# Patient Record
Sex: Female | Born: 1950 | Race: White | Hispanic: No | Marital: Married | Smoking: Current every day smoker
Health system: Southern US, Community
[De-identification: ages and names within clinical notes are randomized; demographics above are authoritative.]

## PROBLEM LIST (undated history)

## (undated) DIAGNOSIS — Z86718 Personal history of other venous thrombosis and embolism: Secondary | ICD-10-CM

## (undated) DIAGNOSIS — E039 Hypothyroidism, unspecified: Secondary | ICD-10-CM

## (undated) DIAGNOSIS — K219 Gastro-esophageal reflux disease without esophagitis: Secondary | ICD-10-CM

## (undated) DIAGNOSIS — I10 Essential (primary) hypertension: Secondary | ICD-10-CM

## (undated) DIAGNOSIS — M1990 Unspecified osteoarthritis, unspecified site: Secondary | ICD-10-CM

## (undated) DIAGNOSIS — I739 Peripheral vascular disease, unspecified: Secondary | ICD-10-CM

## (undated) DIAGNOSIS — E119 Type 2 diabetes mellitus without complications: Secondary | ICD-10-CM

## (undated) HISTORY — PX: OTHER SURGICAL HISTORY: SHX169

## (undated) HISTORY — PX: ABDOMINAL HYSTERECTOMY: SHX81

## (undated) HISTORY — PX: BACK SURGERY: SHX140

## (undated) HISTORY — PX: VARICOSE VEIN SURGERY: SHX832

---

## 2006-03-01 ENCOUNTER — Inpatient Hospital Stay (HOSPITAL_COMMUNITY): Admission: RE | Admit: 2006-03-01 | Discharge: 2006-03-04 | Payer: Self-pay | Admitting: Neurological Surgery

## 2006-03-28 ENCOUNTER — Encounter: Admission: RE | Admit: 2006-03-28 | Discharge: 2006-03-28 | Payer: Self-pay | Admitting: Neurological Surgery

## 2006-06-06 ENCOUNTER — Encounter: Admission: RE | Admit: 2006-06-06 | Discharge: 2006-06-06 | Payer: Self-pay | Admitting: Neurological Surgery

## 2006-09-04 ENCOUNTER — Encounter: Admission: RE | Admit: 2006-09-04 | Discharge: 2006-09-04 | Payer: Self-pay | Admitting: Neurological Surgery

## 2006-12-04 ENCOUNTER — Encounter: Admission: RE | Admit: 2006-12-04 | Discharge: 2006-12-04 | Payer: Self-pay | Admitting: Neurological Surgery

## 2007-03-05 ENCOUNTER — Encounter: Admission: RE | Admit: 2007-03-05 | Discharge: 2007-03-05 | Payer: Self-pay | Admitting: Neurological Surgery

## 2010-10-01 NOTE — Discharge Summary (Signed)
[name], Holder               ACCOUNT NO.:  [id number]   MEDICAL RECORD NO.:  [id number]          PATIENT TYPE:  INP   LOCATION:  3003                         FACILITY:  [facility]   PHYSICIAN:  Tia Alert, MD     DATE OF BIRTH:  1951/05/12   DATE OF ADMISSION:  03/01/2006  DATE OF DISCHARGE:  03/04/2006                                 DISCHARGE SUMMARY   ADMITTING DIAGNOSES:  1. 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  2. Back and leg pain.   PROCEDURE:  Transforaminal lumbar interbody fusion L4-5.   HISTORY OF PRESENT ILLNESS:  [name] is a very pleasant 60 year old  female who presented with severe back pain and leg pain, and was found to  have severe degenerative disk disease at L4-5 with collapse of the disk  space with a disk herniation.  She had severe back and left leg pain.  She  had failed medical management.  I recommended a transforaminal lumbar  interbody fusion at L4-5 with non-segmental instrumentation.  She understood  the risks, benefits and suspected outcome, and wished to proceed.   HOSPITAL COURSE:  The patient was admitted on March 01, 2006, and taken to  the operating room where she underwent a TLIF at L4-5.  The patient  tolerated the procedure well, and then was taken to the recovery room and  then to the neurosurgical floor in stable condition.  For details of the  operative procedure, please see the dictated operative note.  The patient's  hospital course was routine, there were no complications.  The first night  she spent at bedrest with a Foley catheter in place, and a Dilaudid low-dose  PCA for pain control.  The next day she was allowed out of bed.  She was  able to get to the bathroom without difficulty.  Hemovac drain remained in  place until postoperative day 2.  She continued on a PCA until postoperative  day 2.  On postoperative day 2 her Hemovac was removed, and she was taken  off of her PCA.  Her pain was  well-controlled on oral pain medication.  She  was getting to the point she was walking in the hallways without difficulty.  She was afebrile with stable vital signs was tolerating a regular diet.  She  was discharged home in stable condition on November 02, 2005.   DISCHARGE MEDICATIONS:  1. Percocet 5/325 one to two p.o. q.6 h p.r.n. pain #90 pills and no      refills.  2. Flexeril 10 mg p.o. t.i.d. #60 pills and two refills.   Her return appointment is in 2 weeks.   FINAL DIAGNOSIS:  Transforaminal lumbar interbody fusion L4-5.      Tia Alert, MD  Electronically Signed     DSJ/MEDQ  D:  03/04/2006  T:  03/05/2006  Job:  [id number]

## 2010-10-01 NOTE — Op Note (Signed)
NAMEDARIEN, MIGNOGNA               ACCOUNT NO.:  [id number]   MEDICAL RECORD NO.:  [id number]          PATIENT TYPE:  INP   LOCATION:  2899                         FACILITY:  [facility]   PHYSICIAN:  Tia Alert, MD     DATE OF BIRTH:  [birth year]   DATE OF PROCEDURE:  03/01/2006  DATE OF DISCHARGE:                                 OPERATIVE REPORT   PREOPERATIVE DIAGNOSIS:  Severe degenerative disease, L4-5 with disk  herniation and foraminal stenosis with back and left leg pain.   POSTOPERATIVE DIAGNOSIS:  Severe degenerative disease, L4-5 with disk  herniation and foraminal stenosis with back and left leg pain.   PROCEDURE:  1. Decompressive lumbar hemilaminectomy, hemifacetectomy and foraminotomy,      L4-5 on the left for central canal L4 and L5 nerve root decompression      requiring more work than is typically required for simple      transforaminal lumbar interbody fusion procedure.  2. Transforaminal lumbar interbody fusion, L4-5 on the left utilizing a 8      x 22 mm PEEK interbody cage packed with local autograft and the BMP      soaked sponge.  Posterior lateral arthrodesis, L4-5 on the right      utilizing BMP soaked sponges and local autograft.  3. Nonsegmental fixation, L4-5 utilizing the legacy pedicle screw and rod      fixation on the left with a Spire interspinous plate.   SURGEON:  Dr. Marikay Alar.   ASSISTANT:  Dr. Donalee Citrin.   ANESTHESIA:  General tracheal.   COMPLICATIONS:  None apparent.   INDICATIONS FOR PROCEDURE:  Ms. Galli is 60 year old female who presented  with back and left leg pain.  She had tried medical management for some time  without significant relief.  She had MRI which showed severe degenerative  disease with scoliosis at L4-5, concave to the left with severe foraminal  stenosis at L4-5 on the left side with a foraminal disk herniation.  I  recommended a transforaminal lumbar interbody fusion from the left side to  decompress the canal  and opened up the foramen followed by instrumented  fusion utilizing pedicle screws and a Spire plate.  The patient understood  the risks, benefits, expected outcome and wished to proceed.   DESCRIPTION OF PROCEDURE:  The patient was taken to operating room after  induction of adequate generalized endotracheal anesthesia, she was rolled  into prone position on chest rolls and all pressure points padded on her  lumbar region, with DuraPrep and draped in the usual sterile fashion.  7 mL  local anesthesia was injected and a dorsal midline incision was made and  carried down to the lumbosacral fascia.  The fascia was opened bilaterally  and taken down in subperiosteal fashion to expose L4-5 interspace.  Intraoperative fluoroscopy confirmed my level and then I took the dissection  out over the facettes at L4-5 on the left side to expose the L4-L5  transverse processes.  I then used the Kerrison punch to perform a  hemilaminectomy, hemifacetectomy and foraminotomy at L4-5 on the left  side.  The underlying yellow ligament was removed to expose underlying dura and L5  nerve root.  I dissected along the medial pedicle wall to the superior part  of the pedicle and carried the dissection out into the foramen.  I also  identified the L4 nerve root followed out into the foramen decompressed it.  When I was done, a complete facetectomy had been completed, the foramen was  wide open and L4-L5 nerve roots were well decompressed.  She had a  significant spondylitic ridge over the disk space which was removed with  osteophyte remover.  The disk space was severely collapsed.  We were able to  get into this with a chisel and opened the disk space.  We then sequentially  distracted the disk space from the left side up to 8 mm checking this under  fluoroscopy.  We then prepared the disk space utilizing combination of  scrapers and curettes to perform as thorough a diskectomy as we could from a  TLIF approach.   We tried to remove disk from the midline and from the  opposite side with pituitary rongeurs and with these curettes.  Once our  diskectomy was complete and we had our end plates prepared.  We packed the  midline and with BMP soaked sponge and local autograft and then used a 8 x  22 mm PEEK interbody cage and tapped this into position from a TLIF approach  at L4-5 on the left side.  We then checked this under fluoroscopy. It was  very difficult to get this to cross the midline because of the collapsed  disk space and the tightness of this collapse.  We then turned our attention  to the nonsegmental fixation.  We localized our pedicle screw entry points  on the left side utilizing surface landmarks and lateral fluoroscopy.  We  probed each pedicle with a pedicle probe, tapped each pedicle with a 5.5  tap, palpated each pedicle with a ball probe to assure no break in the  cortex and placed 6.5 x 40 mm pedicle screws into the pedicles of L4 and L5  on the left side.  We then turned our attention to the [facility] plating.  We  used a right angle dissector to create a hole in the interspinous ligament  between L4 and L5 and then brought the post of the Spire clamp through this,  used clamps to close the spire plate on the spinous process and then used a  locking nut to tighten this into place.  With lateral x-ray and it seemed  like he was quite low so we removed the locking nut, removed the Spire plate  and inspected where we had this and ended up we were in the correct place  with our clamp across the L4-L5 spinous processes.  Therefore we tightened  it once again and locked it and into position.  We then irrigated with  saline solution containing bacitracin, dried all bleeding points, bipolar  cautery, decorticated the lamina of L4-L5 on the patient's right side and  placed a mixture of BMP soaked sponges and local autograft out over this for interlaminar fusion on the patient's right side.  We  then placed a medium  Hemovac drain through a separate stab incision and closed the fascia with 0  Vicryl, closed subcutaneous and subcuticular tissue with 2-0 and 3-0 Vicryl  and closed the skin with Benzoin and Steri-Strips.  The drapes removed.  Sterile dressing was applied.  The patient  awakened from general anesthesia  and transported to recovery room in stable condition.  At the end of the  procedure all sponge, needle and instrument counts were correct.      Tia Alert, MD  Electronically Signed     DSJ/MEDQ  D:  03/01/2006  T:  03/02/2006  Job:  [id number]

## 2011-11-14 ENCOUNTER — Other Ambulatory Visit: Payer: Self-pay | Admitting: Neurological Surgery

## 2011-11-14 DIAGNOSIS — M549 Dorsalgia, unspecified: Secondary | ICD-10-CM

## 2011-11-15 ENCOUNTER — Ambulatory Visit
Admission: RE | Admit: 2011-11-15 | Discharge: 2011-11-15 | Disposition: A | Discharge status: Home / Self Care | Payer: 59 | Source: Ambulatory Visit | Attending: Neurological Surgery | Admitting: Neurological Surgery

## 2011-11-15 VITALS — BP 123/67 | HR 60

## 2011-11-15 DIAGNOSIS — M549 Dorsalgia, unspecified: Secondary | ICD-10-CM

## 2011-11-15 MED ORDER — ONDANSETRON HCL 4 MG/2ML IJ SOLN: 4.0000 mg | Freq: Four times a day (QID) | INTRAMUSCULAR | Status: DC | PRN

## 2011-11-15 MED ORDER — MEPERIDINE HCL 100 MG/ML IJ SOLN
75.0000 mg | Freq: Once | INTRAMUSCULAR | Status: AC
  Administered 2011-11-15: 75 mg via INTRAMUSCULAR

## 2011-11-15 MED ORDER — IOHEXOL 180 MG/ML  SOLN: 15.0000 mL | Freq: Once | INTRAMUSCULAR | Status: DC | PRN

## 2011-11-15 MED ORDER — DIAZEPAM 5 MG PO TABS
10.0000 mg | ORAL_TABLET | Freq: Once | ORAL | Status: AC
  Administered 2011-11-15: 10 mg via ORAL

## 2011-11-15 MED ORDER — ONDANSETRON HCL 4 MG/2ML IJ SOLN
4.0000 mg | Freq: Once | INTRAMUSCULAR | Status: AC
  Administered 2011-11-15: 4 mg via INTRAMUSCULAR

## 2011-11-15 NOTE — Discharge Instructions (Signed)

## 2011-11-28 ENCOUNTER — Other Ambulatory Visit: Payer: Self-pay | Admitting: Neurological Surgery

## 2011-11-29 ENCOUNTER — Other Ambulatory Visit (HOSPITAL_COMMUNITY): Payer: Self-pay | Admitting: Neurological Surgery

## 2011-11-29 DIAGNOSIS — O2230 Deep phlebothrombosis in pregnancy, unspecified trimester: Secondary | ICD-10-CM

## 2011-11-30 ENCOUNTER — Encounter (HOSPITAL_COMMUNITY): Payer: Self-pay | Admitting: Pharmacy Technician

## 2011-11-30 ENCOUNTER — Other Ambulatory Visit: Payer: Self-pay | Admitting: Radiology

## 2011-12-02 ENCOUNTER — Other Ambulatory Visit: Payer: Self-pay | Admitting: Radiology

## 2011-12-06 ENCOUNTER — Other Ambulatory Visit: Payer: Self-pay | Admitting: Radiology

## 2011-12-08 ENCOUNTER — Encounter (HOSPITAL_COMMUNITY): Payer: Self-pay

## 2011-12-08 ENCOUNTER — Ambulatory Visit (HOSPITAL_COMMUNITY)
Admission: RE | Admit: 2011-12-08 | Discharge: 2011-12-08 | Disposition: A | Discharge status: Home / Self Care | Payer: 59 | Source: Ambulatory Visit | Attending: Neurological Surgery | Admitting: Neurological Surgery

## 2011-12-08 VITALS — BP 125/55 | HR 54 | Temp 97.2°F | Resp 16 | Ht 65.5 in | Wt 195.0 lb

## 2011-12-08 DIAGNOSIS — I10 Essential (primary) hypertension: Secondary | ICD-10-CM | POA: Insufficient documentation

## 2011-12-08 DIAGNOSIS — K219 Gastro-esophageal reflux disease without esophagitis: Secondary | ICD-10-CM | POA: Insufficient documentation

## 2011-12-08 DIAGNOSIS — Z8673 Personal history of transient ischemic attack (TIA), and cerebral infarction without residual deficits: Secondary | ICD-10-CM | POA: Insufficient documentation

## 2011-12-08 DIAGNOSIS — Z86711 Personal history of pulmonary embolism: Secondary | ICD-10-CM | POA: Insufficient documentation

## 2011-12-08 DIAGNOSIS — F17200 Nicotine dependence, unspecified, uncomplicated: Secondary | ICD-10-CM | POA: Insufficient documentation

## 2011-12-08 DIAGNOSIS — O2230 Deep phlebothrombosis in pregnancy, unspecified trimester: Secondary | ICD-10-CM

## 2011-12-08 HISTORY — DX: Gastro-esophageal reflux disease without esophagitis: K21.9

## 2011-12-08 HISTORY — DX: Essential (primary) hypertension: I10

## 2011-12-08 LAB — CBC
HCT: 41.7 % (ref 36.0–46.0)
Hemoglobin: 14.2 g/dL (ref 12.0–15.0)
MCH: 31.1 pg (ref 26.0–34.0)
MCHC: 34.1 g/dL (ref 30.0–36.0)
MCV: 91.4 fL (ref 78.0–100.0)
RDW: 13.6 % (ref 11.5–15.5)

## 2011-12-08 LAB — BASIC METABOLIC PANEL
BUN: 17 mg/dL (ref 6–23)
CO2: 28 mEq/L (ref 19–32)
Chloride: 103 mEq/L (ref 96–112)
Creatinine, Ser: 0.57 mg/dL (ref 0.50–1.10)
GFR calc Af Amer: >90 mL/min (ref >90)
Glucose, Bld: 111 mg/dL — ABNORMAL HIGH (ref 70–99)
Potassium: 3.8 mEq/L (ref 3.5–5.1)

## 2011-12-08 MED ORDER — FENTANYL CITRATE 0.05 MG/ML IJ SOLN
INTRAMUSCULAR | Status: AC | PRN
  Administered 2011-12-08: 25 ug via INTRAVENOUS
  Administered 2011-12-08: 50 ug via INTRAVENOUS

## 2011-12-08 MED ORDER — MIDAZOLAM HCL 2 MG/2ML IJ SOLN
INTRAMUSCULAR | Status: AC
  Filled 2011-12-08: qty 4

## 2011-12-08 MED ORDER — MIDAZOLAM HCL 5 MG/5ML IJ SOLN
INTRAMUSCULAR | Status: AC | PRN
  Administered 2011-12-08: 0.5 mg via INTRAVENOUS
  Administered 2011-12-08: 1 mg via INTRAVENOUS

## 2011-12-08 MED ORDER — IOHEXOL 300 MG/ML  SOLN
100.0000 mL | Freq: Once | INTRAMUSCULAR | Status: AC | PRN
  Administered 2011-12-08: 50 mL via INTRAVENOUS

## 2011-12-08 MED ORDER — SODIUM CHLORIDE 0.9 % IV SOLN
Freq: Once | INTRAVENOUS | Status: AC
  Administered 2011-12-08: 1000 mL via INTRAVENOUS

## 2011-12-08 MED ORDER — FENTANYL CITRATE 0.05 MG/ML IJ SOLN
INTRAMUSCULAR | Status: AC
  Filled 2011-12-08: qty 4

## 2011-12-08 NOTE — ED Notes (Signed)
O2 2L/[place] started 

## 2011-12-08 NOTE — Procedures (Signed)
Successful placement of an infrarenal IVC filter via the R IJ. No immediate complications.

## 2011-12-08 NOTE — H&P (Signed)
Michaela Holder is an 61 y.o. female.   Chief Complaint: pt is scheduled for lumbar fusion surgery 12/15/1011 She has hx of Left lower extr DVT/+PE in 1995 Dr Yetta Barre has requested retrievable inferior vena cava filter placement prior  to surgery; expects long recovery and delayed ambulation HPI: HTN; GERD; + smoker  Past Medical History  Diagnosis Date  . Hypertension   . GERD (gastroesophageal reflux disease)     Past Surgical History  Procedure Date  . Abdominal hysterectomy   . Back surgery     lumbar fusion 2007    History reviewed. No pertinent family history. Social History:  reports that she has been smoking Cigarettes.  She has a 10 pack-year smoking history. She has never used smokeless tobacco. Her alcohol and drug histories not on file.  Allergies:  Allergies  Allergen Reactions  . Keflex (Cephalexin) Hives     (Not in a hospital admission)  Results for orders placed during the hospital encounter of 12/08/11 (from the past 48 hour(s))  APTT     Status: Normal   Collection Time   12/08/11  7:48 AM      Component Value Range Comment   aPTT 28  24 - 37 seconds   BASIC METABOLIC PANEL     Status: Abnormal   Collection Time   12/08/11  7:48 AM      Component Value Range Comment   Sodium 140  135 - 145 mEq/L    Potassium 3.8  3.5 - 5.1 mEq/L    Chloride 103  96 - 112 mEq/L    CO2 28  19 - 32 mEq/L    Glucose, Bld 111 (*) 70 - 99 mg/dL    BUN 17  6 - 23 mg/dL    Creatinine, Ser 1.61  0.50 - 1.10 mg/dL    Calcium 9.5  8.4 - 09.6 mg/dL    GFR calc non Af Amer >90  >90 mL/min    GFR calc Af Amer >90  >90 mL/min   CBC     Status: Normal   Collection Time   12/08/11  7:48 AM      Component Value Range Comment   WBC 6.8  4.0 - 10.5 K/uL    RBC 4.56  3.87 - 5.11 MIL/uL    Hemoglobin 14.2  12.0 - 15.0 g/dL    HCT 04.5  40.9 - 81.1 %    MCV 91.4  78.0 - 100.0 fL    MCH 31.1  26.0 - 34.0 pg    MCHC 34.1  30.0 - 36.0 g/dL    RDW 91.4  78.2 - 95.6 %    Platelets 238   150 - 400 K/uL   PROTIME-INR     Status: Normal   Collection Time   12/08/11  7:48 AM      Component Value Range Comment   Prothrombin Time 13.2  11.6 - 15.2 seconds    INR 0.98  0.00 - 1.49    No results found.  Review of Systems  Constitutional: Negative for fever.  Respiratory: Negative for shortness of breath.   Cardiovascular: Negative for chest pain.  Gastrointestinal: Negative for nausea and vomiting.  Musculoskeletal: Positive for back pain.  Neurological: Negative for headaches.    Blood pressure 118/71, pulse 56, temperature 97.2 F (36.2 C), temperature source Oral, height 5' 5.5" (1.664 m), weight 195 lb (88.451 kg), SpO2 95.00%. Physical Exam  Constitutional: She is oriented to person, place, and time. She appears  well-developed and well-nourished.  Cardiovascular: Normal rate, regular rhythm and normal heart sounds.   No murmur heard. Respiratory: Effort normal and breath sounds normal. She has no wheezes.  GI: Soft. Bowel sounds are normal. There is no tenderness.  Musculoskeletal: Normal range of motion.  Neurological: She is alert and oriented to person, place, and time.  Psychiatric: She has a normal mood and affect. Her behavior is normal. Judgment and thought content normal.     Assessment/Plan Has hx of previous LLE DVT; PE 1995 Lumbar fusion scheduled for 12/15/2011 Pt scheduled for retrievable IVC filter pre surgery Pt aware of procedure benefits and risks and agreeable to proceed. Consent signed.  Michaela Holder A 12/08/2011, 9:01 AM

## 2011-12-09 ENCOUNTER — Encounter (HOSPITAL_COMMUNITY): Payer: Self-pay

## 2011-12-09 ENCOUNTER — Encounter (HOSPITAL_COMMUNITY)
Admission: RE | Admit: 2011-12-09 | Discharge: 2011-12-09 | Disposition: A | Discharge status: Home / Self Care | Payer: 59 | Source: Ambulatory Visit | Attending: Neurological Surgery | Admitting: Neurological Surgery

## 2011-12-09 ENCOUNTER — Ambulatory Visit (HOSPITAL_COMMUNITY)
Admission: RE | Admit: 2011-12-09 | Discharge: 2011-12-09 | Disposition: A | Discharge status: Home / Self Care | Payer: 59 | Source: Ambulatory Visit | Attending: Neurological Surgery | Admitting: Neurological Surgery

## 2011-12-09 DIAGNOSIS — Z01812 Encounter for preprocedural laboratory examination: Secondary | ICD-10-CM | POA: Insufficient documentation

## 2011-12-09 DIAGNOSIS — I10 Essential (primary) hypertension: Secondary | ICD-10-CM | POA: Insufficient documentation

## 2011-12-09 DIAGNOSIS — Z01818 Encounter for other preprocedural examination: Secondary | ICD-10-CM | POA: Insufficient documentation

## 2011-12-09 HISTORY — DX: Personal history of other venous thrombosis and embolism: Z86.718

## 2011-12-09 LAB — DIFFERENTIAL
Basophils Absolute: 0 10*3/uL (ref 0.0–0.1)
Basophils Relative: 0 % (ref 0–1)
Lymphocytes Relative: 33 % (ref 12–46)
Monocytes Absolute: 0.6 10*3/uL (ref 0.1–1.0)
Neutro Abs: 5 10*3/uL (ref 1.7–7.7)
Neutrophils Relative %: 57 % (ref 43–77)

## 2011-12-09 LAB — TYPE AND SCREEN
ABO/RH(D): A POS
Antibody Screen: NEGATIVE

## 2011-12-09 NOTE — Progress Notes (Signed)
Ekg,stress test req. From dr Lisbeth Ply. Had cbc,bmet pt,ptt 0n 7/25 for IV filter inserion.

## 2011-12-09 NOTE — Pre-Procedure Instructions (Signed)
20 Michaela Holder  12/09/2011   Your procedure is scheduled on:  12/15/11  Report to [facility] at 900 AM.  Call this number if you have problems the morning of surgery: [phone]   Remember:   Do not eat food:After Midnight.  May have clear liquids:until Midnight .  Clear liquids include soda, tea, black coffee, apple or grape juice, broth.  Take these medicines the morning of surgery with A SIP OF WATER: flonase,norco,zantac   Do not wear jewelry, make-up or nail polish.  Do not wear lotions, powders, or perfumes. You may wear deodorant.  Do not shave 48 hours prior to surgery. Men may shave face and neck.  Do not bring valuables to the hospital.  Contacts, dentures or bridgework may not be worn into surgery.  Leave suitcase in the car. After surgery it may be brought to your room.  For patients admitted to the hospital, checkout time is 11:00 AM the day of discharge.   Patients discharged the day of surgery will not be allowed to drive home.  Name and phone number of your driver: family  Special Instructions: CHG Shower Use Special Wash: 1/2 bottle night before surgery and 1/2 bottle morning of surgery.   Please read over the following fact sheets that you were given: Pain Booklet, Coughing and Deep Breathing, Blood Transfusion Information, MRSA Information and Surgical Site Infection Prevention

## 2011-12-14 MED ORDER — VANCOMYCIN HCL 500 MG IV SOLR
500.0000 mg | INTRAVENOUS | Status: DC
  Filled 2011-12-14: qty 500

## 2011-12-14 MED ORDER — ENOXAPARIN SODIUM 40 MG/0.4ML ~~LOC~~ SOLN
40.0000 mg | SUBCUTANEOUS | Status: AC
  Administered 2011-12-15: 40 mg via SUBCUTANEOUS
  Filled 2011-12-14: qty 0.4

## 2011-12-14 MED ORDER — DEXAMETHASONE SODIUM PHOSPHATE 10 MG/ML IJ SOLN
10.0000 mg | INTRAMUSCULAR | Status: DC
  Filled 2011-12-14: qty 1

## 2011-12-15 ENCOUNTER — Ambulatory Visit (HOSPITAL_COMMUNITY): Payer: 59 | Admitting: Anesthesiology

## 2011-12-15 ENCOUNTER — Inpatient Hospital Stay (HOSPITAL_COMMUNITY)
Admission: RE | Admit: 2011-12-15 | Discharge: 2011-12-17 | DRG: 460 | Disposition: A | Discharge status: Home / Self Care | Payer: 59 | Source: Ambulatory Visit | Attending: Neurological Surgery | Admitting: Neurological Surgery

## 2011-12-15 ENCOUNTER — Encounter (HOSPITAL_COMMUNITY)
Admission: RE | Disposition: A | Discharge status: Home / Self Care | Payer: Self-pay | Source: Ambulatory Visit | Attending: Neurological Surgery

## 2011-12-15 ENCOUNTER — Ambulatory Visit (HOSPITAL_COMMUNITY): Payer: 59

## 2011-12-15 ENCOUNTER — Encounter (HOSPITAL_COMMUNITY): Payer: Self-pay | Admitting: *Deleted

## 2011-12-15 ENCOUNTER — Encounter (HOSPITAL_COMMUNITY): Payer: Self-pay | Admitting: Anesthesiology

## 2011-12-15 DIAGNOSIS — F17200 Nicotine dependence, unspecified, uncomplicated: Secondary | ICD-10-CM | POA: Diagnosis present

## 2011-12-15 DIAGNOSIS — Z981 Arthrodesis status: Secondary | ICD-10-CM

## 2011-12-15 DIAGNOSIS — I10 Essential (primary) hypertension: Secondary | ICD-10-CM | POA: Diagnosis present

## 2011-12-15 DIAGNOSIS — Z79899 Other long term (current) drug therapy: Secondary | ICD-10-CM

## 2011-12-15 DIAGNOSIS — M48061 Spinal stenosis, lumbar region without neurogenic claudication: Principal | ICD-10-CM | POA: Diagnosis present

## 2011-12-15 DIAGNOSIS — Q762 Congenital spondylolisthesis: Secondary | ICD-10-CM

## 2011-12-15 DIAGNOSIS — K219 Gastro-esophageal reflux disease without esophagitis: Secondary | ICD-10-CM | POA: Diagnosis present

## 2011-12-15 SURGERY — POSTERIOR LUMBAR FUSION 1 WITH HARDWARE REMOVAL
Anesthesia: General | Site: Spine Lumbar | Wound class: Clean

## 2011-12-15 MED ORDER — ONDANSETRON HCL 4 MG/2ML IJ SOLN
INTRAMUSCULAR | Status: DC | PRN
  Administered 2011-12-15: 4 mg via INTRAVENOUS

## 2011-12-15 MED ORDER — HYDROMORPHONE HCL PF 1 MG/ML IJ SOLN
INTRAMUSCULAR | Status: AC
  Filled 2011-12-15: qty 1

## 2011-12-15 MED ORDER — ACETAMINOPHEN 325 MG PO TABS: 650.0000 mg | ORAL_TABLET | ORAL | Status: DC | PRN

## 2011-12-15 MED ORDER — ACETAMINOPHEN 10 MG/ML IV SOLN
INTRAVENOUS | Status: AC
  Administered 2011-12-15: 1000 mg via INTRAVENOUS
  Filled 2011-12-15: qty 100

## 2011-12-15 MED ORDER — SODIUM CHLORIDE 0.9 % IV SOLN: 250.0000 mL | INTRAVENOUS | Status: DC

## 2011-12-15 MED ORDER — ACETAMINOPHEN 650 MG RE SUPP: 650.0000 mg | RECTAL | Status: DC | PRN

## 2011-12-15 MED ORDER — ONDANSETRON HCL 4 MG/2ML IJ SOLN: 4.0000 mg | INTRAMUSCULAR | Status: DC | PRN

## 2011-12-15 MED ORDER — SODIUM CHLORIDE 0.9 % IJ SOLN
3.0000 mL | INTRAMUSCULAR | Status: DC | PRN
  Administered 2011-12-16: 3 mL via INTRAVENOUS

## 2011-12-15 MED ORDER — OXYCODONE-ACETAMINOPHEN 5-325 MG PO TABS
1.0000 | ORAL_TABLET | ORAL | Status: DC | PRN
  Administered 2011-12-15 – 2011-12-17 (×2): 2 via ORAL
  Administered 2011-12-17: 1 via ORAL
  Filled 2011-12-15: qty 2
  Filled 2011-12-15 (×2): qty 1

## 2011-12-15 MED ORDER — POTASSIUM CHLORIDE IN NACL 20-0.9 MEQ/L-% IV SOLN
INTRAVENOUS | Status: DC
  Administered 2011-12-15: 16:00:00 via INTRAVENOUS
  Filled 2011-12-15 (×5): qty 1000

## 2011-12-15 MED ORDER — PROPOFOL 10 MG/ML IV EMUL
INTRAVENOUS | Status: DC | PRN
  Administered 2011-12-15: 100 mg via INTRAVENOUS

## 2011-12-15 MED ORDER — GLYCOPYRROLATE 0.2 MG/ML IJ SOLN
INTRAMUSCULAR | Status: DC | PRN
  Administered 2011-12-15: .5 mg via INTRAVENOUS

## 2011-12-15 MED ORDER — METHOCARBAMOL 500 MG PO TABS
500.0000 mg | ORAL_TABLET | Freq: Four times a day (QID) | ORAL | Status: DC | PRN
  Administered 2011-12-16: 500 mg via ORAL
  Filled 2011-12-15: qty 1

## 2011-12-15 MED ORDER — FENTANYL CITRATE 0.05 MG/ML IJ SOLN
INTRAMUSCULAR | Status: DC | PRN
  Administered 2011-12-15 (×2): 50 ug via INTRAVENOUS
  Administered 2011-12-15: 150 ug via INTRAVENOUS
  Administered 2011-12-15: 50 ug via INTRAVENOUS

## 2011-12-15 MED ORDER — PHENOL 1.4 % MT LIQD: 1.0000 | OROMUCOSAL | Status: DC | PRN

## 2011-12-15 MED ORDER — VANCOMYCIN HCL IN DEXTROSE 1-5 GM/200ML-% IV SOLN: 1000.0000 mg | INTRAVENOUS | Status: DC

## 2011-12-15 MED ORDER — NEOSTIGMINE METHYLSULFATE 1 MG/ML IJ SOLN
INTRAMUSCULAR | Status: DC | PRN
  Administered 2011-12-15: 4 mg via INTRAVENOUS

## 2011-12-15 MED ORDER — VECURONIUM BROMIDE 10 MG IV SOLR
INTRAVENOUS | Status: DC | PRN
  Administered 2011-12-15: 1 mg via INTRAVENOUS
  Administered 2011-12-15: 2 mg via INTRAVENOUS
  Administered 2011-12-15: 1 mg via INTRAVENOUS

## 2011-12-15 MED ORDER — SODIUM CHLORIDE 0.9 % IV SOLN
INTRAVENOUS | Status: DC | PRN
  Administered 2011-12-15: 11:00:00 via INTRAVENOUS

## 2011-12-15 MED ORDER — SODIUM CHLORIDE 0.9 % IV SOLN
INTRAVENOUS | Status: AC
  Filled 2011-12-15: qty 500

## 2011-12-15 MED ORDER — ONDANSETRON HCL 4 MG/2ML IJ SOLN: 4.0000 mg | Freq: Once | INTRAMUSCULAR | Status: DC | PRN

## 2011-12-15 MED ORDER — SODIUM CHLORIDE 0.9 % IJ SOLN
3.0000 mL | Freq: Two times a day (BID) | INTRAMUSCULAR | Status: DC
  Administered 2011-12-16 (×2): 3 mL via INTRAVENOUS

## 2011-12-15 MED ORDER — ARTIFICIAL TEARS OP OINT
TOPICAL_OINTMENT | OPHTHALMIC | Status: DC | PRN
  Administered 2011-12-15: 1 via OPHTHALMIC

## 2011-12-15 MED ORDER — DEXAMETHASONE SODIUM PHOSPHATE 4 MG/ML IJ SOLN
4.0000 mg | Freq: Four times a day (QID) | INTRAMUSCULAR | Status: DC
  Filled 2011-12-15 (×8): qty 1

## 2011-12-15 MED ORDER — 0.9 % SODIUM CHLORIDE (POUR BTL) OPTIME
TOPICAL | Status: DC | PRN
  Administered 2011-12-15: 1000 mL

## 2011-12-15 MED ORDER — VANCOMYCIN HCL IN DEXTROSE 1-5 GM/200ML-% IV SOLN
INTRAVENOUS | Status: AC
  Administered 2011-12-15: 1000 mg via INTRAVENOUS
  Filled 2011-12-15: qty 200

## 2011-12-15 MED ORDER — LIDOCAINE HCL 4 % MT SOLN
OROMUCOSAL | Status: DC | PRN
  Administered 2011-12-15: 4 mL via TOPICAL

## 2011-12-15 MED ORDER — DEXAMETHASONE 4 MG PO TABS
4.0000 mg | ORAL_TABLET | Freq: Four times a day (QID) | ORAL | Status: DC
  Administered 2011-12-15 – 2011-12-17 (×7): 4 mg via ORAL
  Filled 2011-12-15 (×12): qty 1

## 2011-12-15 MED ORDER — ROCURONIUM BROMIDE 100 MG/10ML IV SOLN
INTRAVENOUS | Status: DC | PRN
  Administered 2011-12-15: 50 mg via INTRAVENOUS

## 2011-12-15 MED ORDER — MIDAZOLAM HCL 5 MG/5ML IJ SOLN
INTRAMUSCULAR | Status: DC | PRN
  Administered 2011-12-15: 2 mg via INTRAVENOUS

## 2011-12-15 MED ORDER — ACETAMINOPHEN 10 MG/ML IV SOLN
1000.0000 mg | Freq: Four times a day (QID) | INTRAVENOUS | Status: AC
  Administered 2011-12-15 – 2011-12-16 (×4): 1000 mg via INTRAVENOUS
  Filled 2011-12-15 (×4): qty 100

## 2011-12-15 MED ORDER — VANCOMYCIN HCL IN DEXTROSE 1-5 GM/200ML-% IV SOLN
1000.0000 mg | Freq: Once | INTRAVENOUS | Status: AC
  Administered 2011-12-15: 1000 mg via INTRAVENOUS
  Filled 2011-12-15: qty 200

## 2011-12-15 MED ORDER — EPHEDRINE SULFATE 50 MG/ML IJ SOLN
INTRAMUSCULAR | Status: DC | PRN
  Administered 2011-12-15 (×2): 10 mg via INTRAVENOUS

## 2011-12-15 MED ORDER — HYDROMORPHONE HCL PF 1 MG/ML IJ SOLN
0.2500 mg | INTRAMUSCULAR | Status: DC | PRN
  Administered 2011-12-15 (×4): 0.5 mg via INTRAVENOUS

## 2011-12-15 MED ORDER — BACITRACIN 50000 UNITS IM SOLR
INTRAMUSCULAR | Status: AC
  Filled 2011-12-15: qty 1

## 2011-12-15 MED ORDER — LACTATED RINGERS IV SOLN
INTRAVENOUS | Status: DC | PRN
  Administered 2011-12-15 (×3): via INTRAVENOUS

## 2011-12-15 MED ORDER — SODIUM CHLORIDE 0.9 % IR SOLN
Status: DC | PRN
  Administered 2011-12-15: 09:00:00

## 2011-12-15 MED ORDER — HYDROMORPHONE HCL PF 1 MG/ML IJ SOLN
INTRAMUSCULAR | Status: AC
Start: 2011-12-15 — End: 2011-12-15
  Administered 2011-12-15: 0.5 mg via INTRAVENOUS
  Filled 2011-12-15: qty 1

## 2011-12-15 MED ORDER — BUPIVACAINE HCL (PF) 0.25 % IJ SOLN
INTRAMUSCULAR | Status: DC | PRN
  Administered 2011-12-15: 10 mL

## 2011-12-15 MED ORDER — LIDOCAINE HCL 1 % IJ SOLN
INTRAMUSCULAR | Status: DC | PRN
  Administered 2011-12-15: 100 mg via INTRADERMAL

## 2011-12-15 MED ORDER — DEXAMETHASONE SODIUM PHOSPHATE 10 MG/ML IJ SOLN
INTRAMUSCULAR | Status: DC | PRN
  Administered 2011-12-15: 10 mg via INTRAVENOUS

## 2011-12-15 MED ORDER — SENNA 8.6 MG PO TABS
1.0000 | ORAL_TABLET | Freq: Two times a day (BID) | ORAL | Status: DC
  Administered 2011-12-15 – 2011-12-17 (×5): 8.6 mg via ORAL
  Filled 2011-12-15 (×7): qty 1

## 2011-12-15 MED ORDER — LISINOPRIL 5 MG PO TABS
5.0000 mg | ORAL_TABLET | Freq: Every day | ORAL | Status: DC
  Administered 2011-12-15 – 2011-12-17 (×3): 5 mg via ORAL
  Filled 2011-12-15 (×3): qty 1

## 2011-12-15 MED ORDER — MENTHOL 3 MG MT LOZG: 1.0000 | LOZENGE | OROMUCOSAL | Status: DC | PRN

## 2011-12-15 MED ORDER — DEXTROSE 5 % IV SOLN
INTRAVENOUS | Status: DC | PRN
  Administered 2011-12-15: 08:00:00 via INTRAVENOUS

## 2011-12-15 MED ORDER — THROMBIN 20000 UNITS EX KIT
PACK | CUTANEOUS | Status: DC | PRN
  Administered 2011-12-15: 09:00:00 via TOPICAL

## 2011-12-15 MED ORDER — METHOCARBAMOL 100 MG/ML IJ SOLN
500.0000 mg | Freq: Four times a day (QID) | INTRAVENOUS | Status: DC | PRN
  Administered 2011-12-15: 500 mg via INTRAVENOUS
  Filled 2011-12-15: qty 5

## 2011-12-15 MED ORDER — MORPHINE SULFATE 2 MG/ML IJ SOLN
1.0000 mg | INTRAMUSCULAR | Status: DC | PRN
  Administered 2011-12-15 – 2011-12-16 (×5): 2 mg via INTRAVENOUS
  Filled 2011-12-15 (×5): qty 1

## 2011-12-15 SURGICAL SUPPLY — 63 items
10x22 Cage Telamon ×1 IMPLANT
APL SKNCLS STERI-STRIP NONHPOA (GAUZE/BANDAGES/DRESSINGS) ×1
BAG DECANTER FOR FLEXI CONT (MISCELLANEOUS) ×2 IMPLANT
BENZOIN TINCTURE PRP APPL 2/3 (GAUZE/BANDAGES/DRESSINGS) ×2 IMPLANT
BLADE SURG ROTATE 9660 (MISCELLANEOUS) IMPLANT
BUR MATCHSTICK NEURO 3.0 LAGG (BURR) ×2 IMPLANT
CANISTER SUCTION 2500CC (MISCELLANEOUS) ×2 IMPLANT
CLOTH BEACON ORANGE TIMEOUT ST (SAFETY) ×2 IMPLANT
CONT SPEC 4OZ CLIKSEAL STRL BL (MISCELLANEOUS) ×4 IMPLANT
COVER BACK TABLE 24X17X13 BIG (DRAPES) IMPLANT
COVER TABLE BACK 60X90 (DRAPES) ×2 IMPLANT
DRAPE C-ARM 42X72 X-RAY (DRAPES) ×4 IMPLANT
DRAPE LAPAROTOMY 100X72X124 (DRAPES) ×2 IMPLANT
DRAPE POUCH INSTRU U-SHP 10X18 (DRAPES) ×2 IMPLANT
DRAPE SURG 17X23 STRL (DRAPES) ×2 IMPLANT
DRESSING TELFA 8X3 (GAUZE/BANDAGES/DRESSINGS) ×2 IMPLANT
DRSG OPSITE 4X5.5 SM (GAUZE/BANDAGES/DRESSINGS) ×4 IMPLANT
DURAPREP 26ML APPLICATOR (WOUND CARE) ×2 IMPLANT
ELECT REM PT RETURN 9FT ADLT (ELECTROSURGICAL) ×2
ELECTRODE REM PT RTRN 9FT ADLT (ELECTROSURGICAL) ×1 IMPLANT
EVACUATOR 1/8 PVC DRAIN (DRAIN) ×2 IMPLANT
GAUZE SPONGE 4X4 16PLY XRAY LF (GAUZE/BANDAGES/DRESSINGS) IMPLANT
GLOVE BIO SURGEON STRL SZ8 (GLOVE) ×4 IMPLANT
GLOVE BIOGEL M 8.0 STRL (GLOVE) ×1 IMPLANT
GLOVE BIOGEL PI IND STRL 8.5 (GLOVE) IMPLANT
GLOVE BIOGEL PI INDICATOR 8.5 (GLOVE) ×2
GLOVE EXAM NITRILE MD LF STRL (GLOVE) ×1 IMPLANT
GLOVE SURG SS PI 8.0 STRL IVOR (GLOVE) ×3 IMPLANT
GOWN BRE IMP SLV AUR LG STRL (GOWN DISPOSABLE) ×1 IMPLANT
GOWN BRE IMP SLV AUR XL STRL (GOWN DISPOSABLE) ×4 IMPLANT
GOWN STRL REIN 2XL LVL4 (GOWN DISPOSABLE) ×2 IMPLANT
HEMOSTAT POWDER KIT SURGIFOAM (HEMOSTASIS) IMPLANT
KIT BASIN OR (CUSTOM PROCEDURE TRAY) ×2 IMPLANT
KIT ROOM TURNOVER OR (KITS) ×2 IMPLANT
MILL MEDIUM DISP (BLADE) ×1 IMPLANT
NDL BIOPSY T-LOK 11X4 (NEEDLE) IMPLANT
NDL HYPO 25X1 1.5 SAFETY (NEEDLE) ×1 IMPLANT
NEEDLE BIOPSY T-LOK 11X4 (NEEDLE) ×2 IMPLANT
NEEDLE HYPO 25X1 1.5 SAFETY (NEEDLE) ×2 IMPLANT
NS IRRIG 1000ML POUR BTL (IV SOLUTION) ×2 IMPLANT
PACK FOAM VITOSS 10CC (Orthopedic Implant) ×1 IMPLANT
PACK LAMINECTOMY NEURO (CUSTOM PROCEDURE TRAY) ×2 IMPLANT
PAD ARMBOARD 7.5X6 YLW CONV (MISCELLANEOUS) ×8 IMPLANT
ROD DANEK 30MM (Rod) ×2 IMPLANT
SCREW PEDICLE VA L635 6.5X35M (Screw) ×2 IMPLANT
SCREW PEDICLE VA L635 6.5X45M (Screw) ×1 IMPLANT
SCREW SET NON BREAK OFF (Screw) ×4 IMPLANT
SPONGE LAP 4X18 X RAY DECT (DISPOSABLE) IMPLANT
SPONGE SURGIFOAM ABS GEL 100 (HEMOSTASIS) ×2 IMPLANT
STRIP CLOSURE SKIN 1/2X4 (GAUZE/BANDAGES/DRESSINGS) ×4 IMPLANT
SUT VIC AB 0 CT1 18XCR BRD8 (SUTURE) ×1 IMPLANT
SUT VIC AB 0 CT1 8-18 (SUTURE) ×2
SUT VIC AB 2-0 CP2 18 (SUTURE) ×2 IMPLANT
SUT VIC AB 3-0 CP2 18 (SUTURE) IMPLANT
SUT VIC AB 3-0 SH 8-18 (SUTURE) ×4 IMPLANT
SYR 20ML ECCENTRIC (SYRINGE) ×2 IMPLANT
SYR CONTROL 10ML LL (SYRINGE) ×1 IMPLANT
TOWEL OR 17X24 6PK STRL BLUE (TOWEL DISPOSABLE) ×2 IMPLANT
TOWEL OR 17X26 10 PK STRL BLUE (TOWEL DISPOSABLE) ×2 IMPLANT
TRAP SPECIMEN MUCOUS 40CC (MISCELLANEOUS) ×1 IMPLANT
TRAY FOLEY CATH 14FRSI W/METER (CATHETERS) ×2 IMPLANT
WATER STERILE IRR 1000ML POUR (IV SOLUTION) ×2 IMPLANT
WEDGE TANGENT 10X26MM ×1 IMPLANT

## 2011-12-15 NOTE — Transfer of Care (Signed)
Immediate Anesthesia Transfer of Care Note  Patient: Michaela Holder  Procedure(s) Performed: Procedure(s) (LRB): POSTERIOR LUMBAR FUSION 1 WITH HARDWARE REMOVAL (N/A)  Patient Location: PACU  Anesthesia Type: General  Level of Consciousness: awake, sedated and patient cooperative  Airway & Oxygen Therapy: Patient Spontanous Breathing and Patient connected to face mask oxygen  Post-op Assessment: Report given to PACU RN and Post -op Vital signs reviewed and stable  Post vital signs: Reviewed and stable  Complications: No apparent anesthesia complications

## 2011-12-15 NOTE — Anesthesia Procedure Notes (Signed)
Procedure Name: Intubation Date/Time: 12/15/2011 7:51 AM Performed by: Leona Singleton A Pre-anesthesia Checklist: Patient identified Patient Re-evaluated:Patient Re-evaluated prior to inductionOxygen Delivery Method: Circle system utilized Preoxygenation: Pre-oxygenation with 100% oxygen Intubation Type: IV induction Ventilation: Mask ventilation without difficulty and Oral airway inserted - appropriate to patient size Laryngoscope Size: Hyacinth Meeker and 2 Grade View: Grade I Tube type: Oral Tube size: 7.5 mm Number of attempts: 1 Airway Equipment and Method: Stylet and LTA kit utilized Placement Confirmation: ETT inserted through vocal cords under direct vision,  positive ETCO2 and breath sounds checked- equal and bilateral Secured at: 21 cm Tube secured with: Tape Dental Injury: Teeth and Oropharynx as per pre-operative assessment

## 2011-12-15 NOTE — H&P (Signed)
Subjective: Patient is a 61 y.o. female admitted for PLIF L5-S1. Onset of symptoms was several months ago, progressively worse since that time.  The pain is rated severe and is located at the low back  and radiates to legs. The pain is described as sharp, ache and occurs all day. The symptoms have been progressive. Symptoms are exacerbated by walking. MRI or CT showed adjacent level stenosis L5-S1 below previous fusion.  Past Medical History  Diagnosis Date  . Hypertension   . GERD (gastroesophageal reflux disease)   . Hx of blood clots     Past Surgical History  Procedure Date  . Abdominal hysterectomy   . Back surgery     lumbar fusion 2007  . Varicose vein surgery   . Ivc  filter     Prior to Admission medications   Medication Sig Start Date End Date Taking? Authorizing Provider  fexofenadine (ALLEGRA) 180 MG tablet Take 180 mg by mouth daily.   Yes Historical Provider, MD  fluticasone (FLONASE) 50 MCG/ACT nasal spray Place 2 sprays into the nose daily.   Yes Historical Provider, MD  HYDROcodone-acetaminophen (NORCO/VICODIN) 5-325 MG per tablet Take 1 tablet by mouth every 6 (six) hours as needed. For pain   Yes Historical Provider, MD  lisinopril (PRINIVIL,ZESTRIL) 5 MG tablet Take 5 mg by mouth daily.   Yes Historical Provider, MD  naproxen (NAPROSYN) 500 MG tablet Take 500 mg by mouth 2 (two) times daily with a meal.   Yes Historical Provider, MD  ranitidine (ZANTAC) 150 MG tablet Take 150 mg by mouth 3 times/day as needed-between meals & bedtime. For upset stomach   Yes Historical Provider, MD   Allergies  Allergen Reactions  . Keflex (Cephalexin) Hives    History  Substance Use Topics  . Smoking status: Current Everyday Smoker -- 0.5 packs/day for 20 years    Types: Cigarettes  . Smokeless tobacco: Never Used  . Alcohol Use: No    History reviewed. No pertinent family history.   Review of Systems  Positive ROS: neg  All other systems have been reviewed and were  otherwise negative with the exception of those mentioned in the HPI and as above.  Objective: Vital signs in last 24 hours: Temp:  [98.2 F (36.8 C)] 98.2 F (36.8 C) (08/01 0600) Pulse Rate:  [58] 58  (08/01 0600) Resp:  [18] 18  (08/01 0600) BP: (132)/(71) 132/71 mmHg (08/01 0600) SpO2:  [95 %] 95 % (08/01 0600)  General Appearance: Alert, cooperative, no distress, appears stated age Head: Normocephalic, without obvious abnormality, atraumatic Eyes: PERRL, conjunctiva/corneas clear, EOM's intact, fundi benign, both eyes      Ears: Normal TM's and external ear canals, both ears Throat: Lips, mucosa, and tongue normal; teeth and gums normal Neck: Supple, symmetrical, trachea midline, no adenopathy; thyroid: No enlargement/tenderness/nodules; no carotid bruit or JVD Back: Symmetric, no curvature, ROM normal, no CVA tenderness Lungs: Clear to auscultation bilaterally, respirations unlabored Heart: Regular rate and rhythm, S1 and S2 normal, no murmur, rub or gallop Abdomen: Soft, non-tender, bowel sounds active all four quadrants, no masses, no organomegaly Extremities: Extremities normal, atraumatic, no cyanosis or edema Pulses: 2+ and symmetric all extremities Skin: Skin color, texture, turgor normal, no rashes or lesions  NEUROLOGIC:   Mental status: Alert and oriented x4,  no aphasia, good attention span, fund of knowledge, and memory Motor Exam - grossly normal except mild weakness DF Sensory Exam - grossly normal Reflexes: 1+ Coordination - grossly normal Gait -  grossly normal Balance - grossly normal Cranial Nerves: I: smell Not tested  II: visual acuity  OS: nl    OD: nl  II: visual fields Full to confrontation  II: pupils Equal, round, reactive to light  III,VII: ptosis None  III,IV,VI: extraocular muscles  Full ROM  V: mastication Normal  V: facial light touch sensation  Normal  V,VII: corneal reflex  Present  VII: facial muscle function - upper  Normal  VII:  facial muscle function - lower Normal  VIII: hearing Not tested  IX: soft palate elevation  Normal  IX,X: gag reflex Present  XI: trapezius strength  5/5  XI: sternocleidomastoid strength 5/5  XI: neck flexion strength  5/5  XII: tongue strength  Normal    Data Review Lab Results  Component Value Date   WBC 6.8 12/08/2011   HGB 14.2 12/08/2011   HCT 41.7 12/08/2011   MCV 91.4 12/08/2011   PLT 238 12/08/2011   Lab Results  Component Value Date   NA 140 12/08/2011   K 3.8 12/08/2011   CL 103 12/08/2011   CO2 28 12/08/2011   BUN 17 12/08/2011   CREATININE 0.57 12/08/2011   GLUCOSE 111* 12/08/2011   Lab Results  Component Value Date   INR 0.98 12/08/2011    Assessment/Plan: Patient admitted for PLIF L5-S1. Patient has failed conservative therapy. H/O DVT, IVC Filter placed pre-op.  I explained the condition and procedure to the patient and answered any questions.  Patient wishes to proceed with procedure as planned. Understands risks/ benefits and typical outcomes of procedure.   Aithan Farrelly S 12/15/2011 6:51 AM

## 2011-12-15 NOTE — Preoperative (Signed)
Beta Blockers   Reason not to administer Beta Blockers:Not Applicable 

## 2011-12-15 NOTE — Progress Notes (Signed)
Pharmacy: vancomycin x 1 dose post-op for surgical prophylaxis.  Vanc 1 gm dose given preop at 0748 am.  Wt = 87.7 kg.  Creat 0.57. Creat cl > 90 ml/min.   Plan: vancomycin 1000 mg x 1 dose at 2000 tonights. Thank you Herby Abraham, Pharm.D. [phone] 12/15/2011 3:11 PM

## 2011-12-15 NOTE — Anesthesia Postprocedure Evaluation (Signed)
Anesthesia Post Note  Patient: Michaela Holder  Procedure(s) Performed: Procedure(s) (LRB): POSTERIOR LUMBAR FUSION 1 WITH HARDWARE REMOVAL (N/A)  Anesthesia type: general  Patient location: PACU  Post pain: Pain level controlled  Post assessment: Patient's Cardiovascular Status Stable  Last Vitals:  Filed Vitals:   12/15/11 1200  BP: 136/64  Pulse: 72  Temp:   Resp: 16    Post vital signs: Reviewed and stable  Level of consciousness: sedated  Complications: No apparent anesthesia complications

## 2011-12-15 NOTE — Op Note (Signed)
12/15/2011  11:59 AM  PATIENT:  Michaela Holder  61 y.o. female  PRE-OPERATIVE DIAGNOSIS:  Spondylolisthesis L5-S1 adjacent to previous L4-5 fusion, back and leg pain, foraminal stenosis  POST-OPERATIVE DIAGNOSIS:  Same  PROCEDURE:   1. Decompressive lumbar laminectomy L5-S1 requiring more work than would be required of the typical PLIF procedure in order to adequately decompress the neural elements.  2. Posterior lumbar interbody fusion L5-S1 using a PEEK interbody cage packed with morcellized allograft and autograft and an allograft wedge.  3. Posterior fixation L5-S1 using Sofamor Danek Legacy screws.  4. Intertransverse arthrodesis L5-S1 using morcellized autograft and allograft. 5. removal of interspinous plate and L4 pedicle screw at L4-5  SURGEON:  Marikay Alar, MD  ASSISTANTS: Dr. Jeral Fruit  ANESTHESIA:  General  EBL: 750 ml  Total I/O In: 3345 [I.V.:3150; Blood:195] Out: 1550 [Urine:800; Blood:750]  BLOOD ADMINISTERED:195 CC CELLSAVER  DRAINS: Hemovac   INDICATION FOR PROCEDURE: This patient had undergone a previous fusion at L4-5 and presented with recurrent back and leg pain . CT myelogram showed adjacent level spondylolisthesis L5-S1. She tried medical management without relief. Recommended a decompression and fusion at L5-S1. Patient understood the risks, benefits, and alternatives and potential outcomes and wished to proceed.  PROCEDURE DETAILS:  The patient was brought to the operating room. After induction of generalized endotracheal anesthesia the patient was rolled into the prone position on chest rolls and all pressure points were padded. The patient's lumbar region was cleaned and then prepped with DuraPrep and draped in the usual sterile fashion. Anesthesia was injected and then a dorsal midline incision was made and carried down to the lumbosacral fascia. The fascia was opened and the paraspinous musculature was taken down in a subperiosteal fashion to expose L4-5  and L5-S1. The old hardware at L4-5 was exposed. Significant amounts of bony overgrowth and occurred around the interspinous plate. I removed this bony overgrowth and removed the locking screw from the interspinous plate. I was then able to remove the plate. Removed the locking caps from the L4 and L5 pedicle screws on the left. Removed the rod and then removed the left L4 pedicle screw. L5 pedicle screw was tight. Intraoperative fluoroscopy confirmed my level at L5-S1, and the spinous process was removed and complete lumbar laminectomies, hemi- facetectomies, and foraminotomies were performed at L5 S1. The yellow ligament was removed to expose the underlying dura and nerve roots, and generous foraminotomies were performed to adequately decompress the neural elements. Once the decompression was complete, I turned my attention to the posterior lower lumbar interbody fusion. The epidural venous vasculature was coagulated and cut sharply. Disc space was incised and the initial discectomy was performed with pituitary rongeurs. The disc space was distracted with sequential distractors to a height of 10 mm. We then used a series of scrapers and shavers to prepare the endplates for fusion. The midline was prepared with Epstein curettes. Once the complete discectomy was finished, we packed an appropriate sized peek interbody cage with local autograft and morcellized allograft, gently retracted the nerve root, and tapped the cage into position at L5-S1. We also tapped a tangent interbody bone wedge into the opposite side. The midline was packed with morselized autograft and allograft. We then turned our attention to the posterior fixation. The pedicle screw entry zones were identified utilizing surface landmarks and fluoroscopy. We probed each pedicle with the pedicle probe and tapped each pedicle with the appropriate tap. We palpated with a ball probe to assure no break in  the cortex. We then placed 6 5 x 45 mm pedicle  screws into the pedicles bilaterally at L5 and 6 5 x 35 mm pedicle screws into the sacrum bilaterally. We then decorticated the transverse processes and laid a mixture of morcellized autograft and allograft out over these to perform intertransverse arthrodesis at L5-S1. We then placed lordotic rods into the multiaxial screw heads of the pedicle screws and locked these in position with the locking caps and anti-torque device. We achieved compression of our grafts. We then checked our construct with AP and lateral fluoroscopy. Irrigated with copious amounts of bacitracin-containing saline solution. Placed a medium Hemovac drain through separate stab incision. Inspected the nerve roots once again to assure adequate decompression, lined to the dura with Gelfoam, and closed the muscle and the fascia with 0 Vicryl. Closed the subcutaneous tissues with 2-0 Vicryl and subcuticular tissues with 3-0 Vicryl. The skin was closed with benzoin and Steri-Strips. Dressing was then applied, the patient was awakened from general anesthesia and transported to the recovery room in stable condition. At the end of the procedure all sponge, needle and instrument counts were correct.   PLAN OF CARE: Admit to inpatient   PATIENT DISPOSITION:  PACU - hemodynamically stable.   Delay start of Pharmacological VTE agent (>24hrs) due to surgical blood loss or risk of bleeding:  yes

## 2011-12-15 NOTE — Progress Notes (Signed)
No orders in epic for consent verbage.  Consent will need to be signed in holding area.

## 2011-12-15 NOTE — Anesthesia Preprocedure Evaluation (Addendum)
Anesthesia Evaluation  Patient identified by MRN, date of birth, ID band Patient awake    Reviewed: Allergy & Precautions, H&P , NPO status , Patient's Chart, lab work & pertinent test results  Airway Mallampati: II TM Distance: >3 FB Neck ROM: Full    Dental  (+) Teeth Intact and Dental Advisory Given,    Pulmonary COPD (by CXR)Current Smoker, PE (IVC filter)         Cardiovascular hypertension, Pt. on medications     Neuro/Psych  Neuromuscular disease (back and BLE pain)    GI/Hepatic Neg liver ROS, GERD-  Medicated and Controlled,  Endo/Other  Morbid obesity  Renal/GU negative Renal ROS  negative genitourinary   Musculoskeletal  (+) Arthritis -, Osteoarthritis,    Abdominal (+) + obese,   Peds negative pediatric ROS (+)  Hematology   Anesthesia Other Findings   Reproductive/Obstetrics                       Anesthesia Physical Anesthesia Plan  ASA: II  Anesthesia Plan: General   Post-op Pain Management:    Induction: Intravenous  Airway Management Planned: Oral ETT  Additional Equipment:   Intra-op Plan:   Post-operative Plan: Extubation in OR  Informed Consent: I have reviewed the patients History and Physical, chart, labs and discussed the procedure including the risks, benefits and alternatives for the proposed anesthesia with the patient or authorized representative who has indicated his/her understanding and acceptance.     Plan Discussed with: CRNA and Surgeon  Anesthesia Plan Comments:         Anesthesia Quick Evaluation

## 2011-12-16 MED FILL — Heparin Sodium (Porcine) Inj 1000 Unit/ML: INTRAMUSCULAR | Qty: 30 | Status: AC

## 2011-12-16 MED FILL — Sodium Chloride IV Soln 0.9%: INTRAVENOUS | Qty: 2000 | Status: AC

## 2011-12-16 NOTE — Evaluation (Signed)
Physical Therapy Evaluation Patient Details Name: Michaela Holder MRN: [id number] DOB: Jul 23, 1950 Today's Date: 12/16/2011 Time: 2841-3244 PT Time Calculation (min): 15 min  PT Assessment / Plan / Recommendation Clinical Impression  Pt is a 61 yo female s/p lumbar fusion post op day 1. Pt presents to evaluation with decreased mobility secondary to surgery. Pt will benefit from skilled physical therapy in the acute care setting to address mobility issues to facilitate safe discharge home. Recommend  no follow up PT and pt reports already having RW at home.    PT Assessment  Patient needs continued PT services    Follow Up Recommendations  No PT follow up    Barriers to Discharge None      Equipment Recommendations  None recommended by PT    Recommendations for Other Services     Frequency Min 5X/week    Precautions / Restrictions Precautions Precautions: Back Precaution Booklet Issued: Yes (comment) Precaution Comments: Pt able to verbalize 3/3 back precautions and had already been given a back precaution handout by OT. Required Braces or Orthoses: Spinal Brace Spinal Brace: Lumbar corset Restrictions Weight Bearing Restrictions: No         Mobility  Bed Mobility Details for Bed Mobility Assistance: Pt in chair upon presentation but reported she "didn't need much help" getting out of bed this morning. Transfers Transfers: Sit to Stand;Stand to Sit Sit to Stand: 5: Supervision;From chair/3-in-1;With upper extremity assist Stand to Sit: 5: Supervision;To chair/3-in-1;With upper extremity assist Details for Transfer Assistance: Pt required supervision for safety due to decreasaed mobility and pain. Ambulation/Gait Ambulation/Gait Assistance: 5: Supervision;4: Min guard Ambulation Distance (Feet): 250 Feet Assistive device: Rolling walker Ambulation/Gait Assistance Details: Pt required min guard to supervision for safety due to decreased mobility and required verbal cues to  maintain upright posture and stay inside the walker base while ambulating. Gait Pattern: Step-through pattern;Decreased stride length;Decreased trunk rotation Stairs: Yes Stairs Assistance: 5: Supervision Stairs Assistance Details (indicate cue type and reason): Pt required supervision for safety and verbal cues for sequencing. Stair Management Technique: Two rails Number of Stairs: 2  Wheelchair Mobility Wheelchair Mobility: No    Exercises     PT Diagnosis: Abnormality of gait;Acute pain  PT Problem List: Decreased activity tolerance;Decreased balance;Decreased mobility;Decreased knowledge of use of DME;Decreased knowledge of precautions PT Treatment Interventions: DME instruction;Gait training;Stair training   PT Goals Acute Rehab PT Goals PT Goal Formulation: With patient Time For Goal Achievement: 12/23/11 Potential to Achieve Goals: Good Pt will Roll Supine to Right Side: with modified independence PT Goal: Rolling Supine to Right Side - Progress: Goal set today Pt will Roll Supine to Left Side: with modified independence PT Goal: Rolling Supine to Left Side - Progress: Goal set today Pt will go Supine/Side to Sit: with modified independence PT Goal: Supine/Side to Sit - Progress: Goal set today Pt will go Sit to Stand: with modified independence PT Goal: Sit to Stand - Progress: Goal set today Pt will go Stand to Sit: with modified independence PT Goal: Stand to Sit - Progress: Goal set today Pt will Ambulate: >150 feet;with modified independence;with least restrictive assistive device PT Goal: Ambulate - Progress: Goal set today Pt will Go Up / Down Stairs: 1-2 stairs;with modified independence;with rail(s) PT Goal: Up/Down Stairs - Progress: Goal set today  Visit Information  Last PT Received On: 12/16/11 Assistance Needed: +1    Subjective Data  Subjective: "I have already been up this morning."   Prior Functioning  Home  Living Lives With: Spouse Available Help  at Discharge: Family;Available PRN/intermittently (works during the day) Type of Home: House Home Access: Stairs to enter [employer] of Steps: 1 Entrance Stairs-Rails: Can reach both Home Layout: One level Bathroom Shower/Tub: Health visitor: Handicapped height Bathroom Accessibility: Yes How Accessible: Accessible via walker Home Adaptive Equipment: Walker - rolling;Bedside commode/3-in-1;Shower chair without back;Hand-held shower hose Prior Function Level of Independence: Independent Able to Take Stairs?: Yes Driving: Yes Vocation: Retired Musician: No difficulties Dominant Hand: Right    Cognition  Overall Cognitive Status: Appears within functional limits for tasks assessed/performed Arousal/Alertness: Awake/alert Orientation Level: Appears intact for tasks assessed Behavior During Session: [facility] for tasks performed    Extremity/Trunk Assessment Right Upper Extremity Assessment RUE ROM/Strength/Tone: [facility] for tasks assessed Left Upper Extremity Assessment LUE ROM/Strength/Tone: [facility] for tasks assessed Right Lower Extremity Assessment RLE ROM/Strength/Tone: [facility] for tasks assessed Left Lower Extremity Assessment LLE ROM/Strength/Tone: WFL for tasks assessed Trunk Assessment Trunk Assessment: Normal   Balance    End of Session PT - End of Session Equipment Utilized During Treatment: Gait belt;Back brace Activity Tolerance: Patient tolerated treatment well Patient left: in chair;with call bell/phone within reach;with family/visitor present Nurse Communication: Mobility status;Patient requests pain meds  GP     Valrie Jia 12/16/2011, 11:15 AM

## 2011-12-16 NOTE — Evaluation (Signed)
I have read and agree with the below assessment and plan.   Xiomara Sevillano Helen Whitlow PT, DPT Pager: [phone] 

## 2011-12-16 NOTE — Evaluation (Signed)
Occupational Therapy Evaluation Patient Details Name: SHRUTI ARREY MRN: [id number] DOB: Apr 01, 1951 Today's Date: 12/16/2011 Time: 9629-5284 OT Time Calculation (min): 35 min  OT Assessment / Plan / Recommendation Clinical Impression  Pt admitted for PLIF and hardware removal.  Pt instructed in back precautions, use of AE, and safety.  Pt will have assist of her husband intermittently at home (he works days).  No further OT needs.    OT Assessment  Patient does not need any further OT services    Follow Up Recommendations  Supervision - Intermittent;No OT follow up    Barriers to Discharge      Equipment Recommendations  None recommended by OT    Recommendations for Other Services    Frequency       Precautions / Restrictions Precautions Precautions: Back;Fall Precaution Booklet Issued: Yes (comment) Required Braces or Orthoses: Spinal Brace Spinal Brace: Applied in sitting position;Lumbar corset Restrictions Weight Bearing Restrictions: No   Pertinent Vitals/Pain 6/10, back, RN notified    ADL  Eating/Feeding: Performed;Independent Where Assessed - Eating/Feeding: Chair Grooming: Performed;Wash/dry hands;Supervision/safety Where Assessed - Grooming: Unsupported standing Upper Body Bathing: Simulated;Set up Where Assessed - Upper Body Bathing: Unsupported sitting Lower Body Bathing: Simulated;Moderate assistance Where Assessed - Lower Body Bathing: Supported sit to stand Upper Body Dressing: Performed;Set up Where Assessed - Upper Body Dressing: Unsupported sitting Lower Body Dressing: Performed;Moderate assistance Where Assessed - Lower Body Dressing: Supported sit to Pharmacist, hospital: Research scientist (life sciences) Method: Sit to Barista: Comfort height toilet Toileting - Clothing Manipulation and Hygiene: Performed;Modified independent Where Assessed - Toileting Clothing Manipulation and Hygiene: Sit to stand from  3-in-1 or toilet Equipment Used: Gait belt;Reacher;Sock aid;Long-handled sponge;Long-handled shoe horn;Back brace;Rolling walker Transfers/Ambulation Related to ADLs: supervision with RW to ambulated to bathroom ADL Comments: Pt instructed in use of AE.  Pt thinks she may have a reacher a home.  Will rely on husband until able to to cross foot over opposite knee to dress/bathe feet    OT Diagnosis:    OT Problem List:   OT Treatment Interventions:     OT Goals    Visit Information  Last OT Received On: 12/16/11 Assistance Needed: +1    Subjective Data  Subjective: "My husband will help me with my feet." Patient Stated Goal: Home with husband.   Prior Functioning  Vision/Perception  Home Living Lives With: Spouse Available Help at Discharge: Family;Available PRN/intermittently (works during the day) Type of Home: House Home Access: Stairs to enter [employer] of Steps: 1 Entrance Stairs-Rails: Can reach both (post) Home Layout: One level Bathroom Shower/Tub: Walk-in Contractor: Handicapped height Bathroom Accessibility: Yes How Accessible: Accessible via walker Home Adaptive Equipment: Walker - rolling;Bedside commode/3-in-1;Shower chair without back;Hand-held shower hose;Other (comment) (may still have the reacher) Prior Function Level of Independence: Independent Able to Take Stairs?: Yes Driving: Yes Vocation: Retired Musician: No difficulties Dominant Hand: Right      Cognition  Overall Cognitive Status: Appears within functional limits for tasks assessed/performed Arousal/Alertness: Awake/alert Orientation Level: Appears intact for tasks assessed Behavior During Session: [facility] for tasks performed    Extremity/Trunk Assessment Right Upper Extremity Assessment RUE ROM/Strength/Tone: [facility] for tasks assessed Left Upper Extremity Assessment LUE ROM/Strength/Tone: WFL for tasks assessed Trunk Assessment Trunk  Assessment: Normal   Mobility Bed Mobility Bed Mobility: Rolling Left;Left Sidelying to Sit;Sitting - Scoot to Edge of Bed Rolling Left: 6: Modified independent (Device/Increase time);With rail Left Sidelying to Sit: 6: Modified independent (Device/Increase time);HOB  flat;With rails Sitting - Scoot to Edge of Bed: 6: Modified independent (Device/Increase time) Transfers Transfers: Sit to Stand;Stand to Sit Sit to Stand: 5: Supervision;From bed;From toilet Stand to Sit: 5: Supervision;To toilet;To chair/3-in-1;With upper extremity assist   Exercise    Balance    End of Session OT - End of Session Activity Tolerance: Patient tolerated treatment well Patient left: in chair;with call bell/phone within reach;with family/visitor present Nurse Communication: Other (comment) (pt's pain level, pt urinated)  GO     Evern Bio 12/16/2011, 9:37 AM [phone]

## 2011-12-16 NOTE — Progress Notes (Addendum)
RN was notified of abnormal pulse and BP

## 2011-12-16 NOTE — Progress Notes (Signed)
Patient ID: Michaela Holder, female   DOB: 09-23-1950, 61 y.o.   MRN: [id number] Really doing well. Back appropriately sore, but no leg pain. Pre-op DF weakness better. Ambulated already and did well per nurse's report. PT/OT, mobilize, maybe home tomorrow.

## 2011-12-16 NOTE — Progress Notes (Signed)
RN was notified of low pulse rate.

## 2011-12-16 NOTE — Progress Notes (Signed)
Patient did walk in the hall way early morning. Tolerated very well.  

## 2011-12-17 MED ORDER — METHOCARBAMOL 500 MG PO TABS: 500.0000 mg | ORAL_TABLET | Freq: Four times a day (QID) | ORAL | Status: AC | PRN

## 2011-12-17 MED ORDER — OXYCODONE-ACETAMINOPHEN 5-325 MG PO TABS: 1.0000 | ORAL_TABLET | Freq: Four times a day (QID) | ORAL | Status: AC | PRN

## 2011-12-17 NOTE — Progress Notes (Signed)
Physical Therapy Treatment Patient Details Name: Michaela Holder MRN: [id number] DOB: [birth year] Today's Date: 12/17/2011 Time: 1211-1222 PT Time Calculation (min): 11 min  PT Assessment / Plan / Recommendation Comments on Treatment Session  Great progress today with increased gait distance and stair education completed. Pt has all needed DME at home from previous surgeries.    Follow Up Recommendations  No PT follow up       Equipment Recommendations  None recommended by PT       Frequency Min 5X/week   Plan Discharge plan remains appropriate;Frequency remains appropriate    Precautions / Restrictions Precautions Precautions: Back Precaution Comments: pt independent with recall and demo during session of 3/3 back precautions. Spinal Brace: Lumbar corset;Applied in sitting position (pt independent with brace don/doff)    Pertinent Vitals/Pain Rated her pain 1-2/10 before and after session. Was premedicated.    Mobility  Transfers Transfers: Sit to Stand;Stand to Sit Sit to Stand: 6: Modified independent (Device/Increase time);From chair/3-in-1;With upper extremity assist;With armrests Stand to Sit: 6: Modified independent (Device/Increase time);To chair/3-in-1;With upper extremity assist;With armrests Ambulation/Gait Ambulation/Gait Assistance: 6: Modified independent (Device/Increase time) Ambulation Distance (Feet): 200 Feet Assistive device: Rolling walker Ambulation/Gait Assistance Details: no cues or assistance required with gait Gait Pattern: Within Functional Limits;Step-through pattern Gait velocity: decreased cadence Stairs: Yes Stairs Assistance: 5: Supervision Stairs Assistance Details (indicate cue type and reason): cues for sequence/technique with RW for going up/down one step without rails Stair Management Technique: No rails;Step to pattern;Forwards;With walker Number of Stairs: 1      PT Goals Acute Rehab PT Goals PT Goal: Sit to Stand - Progress:  Met PT Goal: Stand to Sit - Progress: Met PT Goal: Ambulate - Progress: Met PT Goal: Up/Down Stairs - Progress: Progressing toward goal  Visit Information  Last PT Received On: 12/17/11 Assistance Needed: +1    Subjective Data  Subjective: No new complaints, agreeable to therapy today. Ready for discharge after therapy is done.   Cognition  Overall Cognitive Status: Appears within functional limits for tasks assessed/performed Arousal/Alertness: Awake/alert Orientation Level: Appears intact for tasks assessed Behavior During Session: [facility] for tasks performed       End of Session PT - End of Session Equipment Utilized During Treatment: Gait belt;Back brace Activity Tolerance: Patient tolerated treatment well Patient left: in chair;with family/visitor present Nurse Communication: Mobility status;Other (comment) (pt requested her discharge paperwork)    Sallyanne Kuster 12/17/2011, 12:29 PM  Sallyanne Kuster, PTA Office- [phone]

## 2011-12-17 NOTE — Progress Notes (Signed)
Discharge instructions provided to patient and husband at bedside.  Patient received prescriptions from MD.  No questions regarding medications, care of incision or follow up appointments.  Patient able to verbal state back precautions.  No questions at this time.  Patient left unit in wheelchair wearing eye glasses, with personal belongings and in stable condition.  Osvaldo Angst, RN-----------

## 2011-12-17 NOTE — Discharge Summary (Signed)
Physician Discharge Summary  Patient ID: Michaela Holder MRN: [id number] DOB/AGE: Aug 08, 1950 61 y.o.  Admit date: 12/15/2011 Discharge date: 12/17/2011  Admission Diagnoses: adjacent level stenosis    Discharge Diagnoses: same   Discharged Condition: good  Hospital Course: The patient was admitted on 12/15/2011 and taken to the operating room where the patient underwent PLIF. The patient tolerated the procedure well and was taken to the recovery room and then to the floor in stable condition. The hospital course was routine. There were no complications. The wound remained clean dry and intact. Pt had appropriate back soreness. No complaints of leg pain or new N/T/W. The patient remained afebrile with stable vital signs, and tolerated a regular diet. The patient continued to increase activities, and pain was well controlled with oral pain medications.   Consults: None  Significant Diagnostic Studies:  Results for orders placed during the hospital encounter of 12/09/11  SURGICAL PCR SCREEN      Component Value Range   MRSA, PCR NEGATIVE  NEGATIVE   Staphylococcus aureus NEGATIVE  NEGATIVE  TYPE AND SCREEN      Component Value Range   ABO/RH(D) A POS     Antibody Screen NEG     Sample Expiration 12/23/2011    DIFFERENTIAL      Component Value Range   Neutrophils Relative 57  43 - 77 %   Neutro Abs 5.0  1.7 - 7.7 K/uL   Lymphocytes Relative 33  12 - 46 %   Lymphs Abs 2.9  0.7 - 4.0 K/uL   Monocytes Relative 6  3 - 12 %   Monocytes Absolute 0.6  0.1 - 1.0 K/uL   Eosinophils Relative 4  0 - 5 %   Eosinophils Absolute 0.3  0.0 - 0.7 K/uL   Basophils Relative 0  0 - 1 %   Basophils Absolute 0.0  0.0 - 0.1 K/uL    Dg Chest 2 View  12/09/2011  *RADIOLOGY REPORT*  Clinical Data: Hypertension  CHEST - 2 VIEW  Comparison: None  Findings: Heart size and mediastinal contours are normal.  No pleural effusion or edema.  There are coarsened interstitial markings noted bilaterally compatible  with COPD.  No airspace consolidation noted.  Scar like density is noted in the right base.  IMPRESSION:  1.  No acute cardiopulmonary abnormalities. 2.  Right base scarring  Original Report Authenticated By: Rosealee Albee, M.D.   Dg Lumbar Spine 2-3 Views  12/15/2011  *RADIOLOGY REPORT*  Clinical Data: L5-S1 posterior fusion.  DG C-ARM 1-60 MIN,LUMBAR SPINE - 2-3 VIEW  Comparison: CT myelogram 11/15/2011  Findings: Three intraoperative spot images demonstrate changes of new anterior posterior fusion of L5-S1.  Removal of hardware at the L4 level and posterior spinous process clamp at L4-5.  Normal alignment.  No hardware complicating feature.  IMPRESSION: Posterior fusion changes at L5-S1.  Original Report Authenticated By: Cyndie Chime, M.D.   Ir Ivc Filter Plmt / S&i /img Guid/mod Sed  12/08/2011  *RADIOLOGY REPORT*  Indication:  History of lower extremity DVT and pulmonary embolism, now with impending back surgery.  ULTRASOUND GUIDANCE FOR VASCULAR ACCESS IVC CATHETERIZATION AND VENOGRAM IVC FILTER INSERTION  Comparison: Lumbar spine CT - 11/15/2011  Medications: Fentanyl 75 mcg IV; Versed 1.5 mg IV  Contrast: 50 mL Omnipaque-300  Sedation time: 11 minutes  Fluoroscopy time: 1.4 minutes  Complications: None immediate  PROCEDURE:  Informed consent was obtained from the patient following explanation of the procedure, risks (including but not  limited to caval penetration, caval thrombosis, non-retrieval), benefits and alternatives.  The patient understands, agrees and consents for the procedure.  All questions were addressed.  A time out was performed prior to the initiation of the procedure.  Maximal barrier sterile technique utilized including caps, mask, sterile gowns, sterile gloves, large sterile drape, hand hygiene, and Betadine prep.  Under sterile condition and local anesthesia, right internal jugular venous access was performed with ultrasound.  An ultrasound image was saved and sent to PACS.   Over a guide wire, the IVC filter delivery sheath and inner dilator were advanced into the IVC just above the IVC bifurcation.  Contrast injection was performed for an IVC venogram.  Through the delivery sheath, a retrievable Celect IVC filter was deployed below the level of the renal veins and above the IVC bifurcation.  Several spot radiographs were obtained in multiple obliquities.  The delivery sheath was removed and hemostasis was obtained with manual compression.  A dressing was placed.  The patient tolerated the procedure well without immediate post procedural complication.  Findings:  The IVC is patent without evidence of thrombus, stenosis, or occlusion.  No variant venous anatomy.   Successful placement of the IVC filter below the level of the renal veins.  IMPRESSION:  Successful ultrasound and fluoroscopic guided placement of an infrarenal retrievable IVC filter.  Original Report Authenticated By: Waynard Reeds, M.D.   Dg C-arm 1-60 Min  12/15/2011  *RADIOLOGY REPORT*  Clinical Data: L5-S1 posterior fusion.  DG C-ARM 1-60 MIN,LUMBAR SPINE - 2-3 VIEW  Comparison: CT myelogram 11/15/2011  Findings: Three intraoperative spot images demonstrate changes of new anterior posterior fusion of L5-S1.  Removal of hardware at the L4 level and posterior spinous process clamp at L4-5.  Normal alignment.  No hardware complicating feature.  IMPRESSION: Posterior fusion changes at L5-S1.  Original Report Authenticated By: Cyndie Chime, M.D.    Antibiotics:  Anti-infectives     Start     Dose/Rate Route Frequency Ordered Stop   12/15/11 2000   vancomycin (VANCOCIN) IVPB 1000 mg/200 mL premix        1,000 mg 200 mL/hr over 60 Minutes Intravenous  Once 12/15/11 1508 12/15/11 2130   12/15/11 0848   bacitracin 50,000 Units in sodium chloride irrigation 0.9 % 500 mL irrigation  Status:  Discontinued          As needed 12/15/11 0848 12/15/11 1126   12/15/11 0712   bacitracin 96295 UNITS injection      Comments: DAY, DORY: cabinet override         12/15/11 0712 12/15/11 1914   12/15/11 0600   vancomycin (VANCOCIN) 1 GM/200ML IVPB     Comments: MOORE, TERRI: cabinet override         12/15/11 0600 12/15/11 0748   12/15/11 0552   vancomycin (VANCOCIN) IVPB 1000 mg/200 mL premix  Status:  Discontinued        1,000 mg 200 mL/hr over 60 Minutes Intravenous 120 min pre-op 12/15/11 0552 12/15/11 1400   12/15/11 0000   vancomycin (VANCOCIN) 500 mg in sodium chloride 0.9 % 100 mL IVPB  Status:  Discontinued        500 mg 100 mL/hr over 60 Minutes Intravenous 120 min pre-op 12/14/11 1244 12/15/11 0552          Discharge Exam: Blood pressure 121/60, pulse 76, temperature 97.4 F (36.3 C), temperature source Oral, resp. rate 18, height 5\' 6"  (1.676 m), weight 88.5 kg (195 lb  1.7 oz), SpO2 96.00%. Neurologic: Grossly normal Incision CDI  Discharge Medications:   Medication List  As of 12/17/2011 10:45 AM   TAKE these medications         fexofenadine 180 MG tablet   Commonly known as: ALLEGRA   Take 180 mg by mouth daily.      fluticasone 50 MCG/ACT nasal spray   Commonly known as: FLONASE   Place 2 sprays into the nose daily.      HYDROcodone-acetaminophen 5-325 MG per tablet   Commonly known as: NORCO/VICODIN   Take 1 tablet by mouth every 6 (six) hours as needed. For pain      lisinopril 5 MG tablet   Commonly known as: PRINIVIL,ZESTRIL   Take 5 mg by mouth daily.      methocarbamol 500 MG tablet   Commonly known as: ROBAXIN   Take 1 tablet (500 mg total) by mouth every 6 (six) hours as needed.      naproxen 500 MG tablet   Commonly known as: NAPROSYN   Take 500 mg by mouth 2 (two) times daily with a meal.      oxyCODONE-acetaminophen 5-325 MG per tablet   Commonly known as: PERCOCET/ROXICET   Take 1-2 tablets by mouth every 6 (six) hours as needed for pain.      ranitidine 150 MG tablet   Commonly known as: ZANTAC   Take 150 mg by mouth 3 times/day as needed-between  meals & bedtime. For upset stomach            Disposition: home   Final Dx: PLIF  Discharge Orders    Future Orders Please Complete By Expires   Diet - low sodium heart healthy      Increase activity slowly      Driving Restrictions      Comments:   4 weeks   Lifting restrictions      Comments:   Less than 10 lbs   Call MD for:  temperature >100.4      Call MD for:  persistant nausea and vomiting      Call MD for:  severe uncontrolled pain      Call MD for:  redness, tenderness, or signs of infection (pain, swelling, redness, odor or green/yellow discharge around incision site)      Call MD for:  difficulty breathing, headache or visual disturbances      Call MD for:  hives         Follow-up Information    Follow up with Lekha Dancer S, MD. Schedule an appointment as soon as possible for a visit in 2 weeks.   Contact information:   [address], [address] [place] [address] [phone]           Signed: Tia Alert 12/17/2011, 10:45 AM

## 2012-01-04 ENCOUNTER — Encounter (INDEPENDENT_AMBULATORY_CARE_PROVIDER_SITE_OTHER): Payer: 59 | Admitting: *Deleted

## 2012-01-04 DIAGNOSIS — M79609 Pain in unspecified limb: Secondary | ICD-10-CM

## 2012-01-04 DIAGNOSIS — M25569 Pain in unspecified knee: Secondary | ICD-10-CM

## 2012-01-13 ENCOUNTER — Telehealth: Payer: Self-pay | Admitting: Emergency Medicine

## 2012-01-13 NOTE — Telephone Encounter (Signed)
S/W [facility] W/ DR DAVID [place], STATED PT HAS A POS. DVT AND FILTER CAN NOT BE REMOVED AT THIS TIME.  THEY WILL CONTACT us IF AND WHEN THE FILTER NEEDS TO COME OUT.

## 2012-01-18 ENCOUNTER — Telehealth: Payer: Self-pay | Admitting: Emergency Medicine

## 2012-01-18 NOTE — Telephone Encounter (Signed)
ERROR

## 2012-02-27 ENCOUNTER — Other Ambulatory Visit (HOSPITAL_COMMUNITY): Payer: Self-pay | Admitting: Neurological Surgery

## 2012-02-27 DIAGNOSIS — I82409 Acute embolism and thrombosis of unspecified deep veins of unspecified lower extremity: Secondary | ICD-10-CM

## 2012-02-28 ENCOUNTER — Other Ambulatory Visit: Payer: Self-pay | Admitting: Radiology

## 2012-03-02 ENCOUNTER — Encounter (HOSPITAL_COMMUNITY): Payer: Self-pay

## 2012-03-02 ENCOUNTER — Ambulatory Visit (HOSPITAL_COMMUNITY)
Admission: RE | Admit: 2012-03-02 | Discharge: 2012-03-02 | Disposition: A | Discharge status: Home / Self Care | Payer: 59 | Source: Ambulatory Visit | Attending: Neurological Surgery | Admitting: Neurological Surgery

## 2012-03-02 ENCOUNTER — Other Ambulatory Visit (HOSPITAL_COMMUNITY): Payer: Self-pay | Admitting: Neurological Surgery

## 2012-03-02 VITALS — BP 111/55 | HR 46 | Temp 97.4°F | Resp 10 | Ht 65.5 in | Wt 180.0 lb

## 2012-03-02 DIAGNOSIS — I82409 Acute embolism and thrombosis of unspecified deep veins of unspecified lower extremity: Secondary | ICD-10-CM

## 2012-03-02 DIAGNOSIS — Z4689 Encounter for fitting and adjustment of other specified devices: Secondary | ICD-10-CM | POA: Insufficient documentation

## 2012-03-02 DIAGNOSIS — I10 Essential (primary) hypertension: Secondary | ICD-10-CM | POA: Insufficient documentation

## 2012-03-02 DIAGNOSIS — Z86711 Personal history of pulmonary embolism: Secondary | ICD-10-CM | POA: Insufficient documentation

## 2012-03-02 DIAGNOSIS — Z7901 Long term (current) use of anticoagulants: Secondary | ICD-10-CM | POA: Insufficient documentation

## 2012-03-02 DIAGNOSIS — K219 Gastro-esophageal reflux disease without esophagitis: Secondary | ICD-10-CM | POA: Insufficient documentation

## 2012-03-02 DIAGNOSIS — Z86718 Personal history of other venous thrombosis and embolism: Secondary | ICD-10-CM | POA: Insufficient documentation

## 2012-03-02 LAB — CBC WITH DIFFERENTIAL/PLATELET
Eosinophils Absolute: 0.2 10*3/uL (ref 0.0–0.7)
Eosinophils Relative: 3 % (ref 0–5)
HCT: 41.7 % (ref 36.0–46.0)
Hemoglobin: 14 g/dL (ref 12.0–15.0)
Lymphocytes Relative: 37 % (ref 12–46)
Lymphs Abs: 2.3 10*3/uL (ref 0.7–4.0)
MCH: 30.8 pg (ref 26.0–34.0)
MCV: 91.9 fL (ref 78.0–100.0)
Monocytes Absolute: 0.6 10*3/uL (ref 0.1–1.0)
Monocytes Relative: 10 % (ref 3–12)
RBC: 4.54 MIL/uL (ref 3.87–5.11)
WBC: 6.2 10*3/uL (ref 4.0–10.5)

## 2012-03-02 LAB — BASIC METABOLIC PANEL
CO2: 26 mEq/L (ref 19–32)
Calcium: 9.3 mg/dL (ref 8.4–10.5)
Chloride: 101 mEq/L (ref 96–112)
Glucose, Bld: 105 mg/dL — ABNORMAL HIGH (ref 70–99)
Potassium: 4.5 mEq/L (ref 3.5–5.1)
Sodium: 137 mEq/L (ref 135–145)

## 2012-03-02 MED ORDER — SODIUM CHLORIDE 0.9 % IV SOLN
INTRAVENOUS | Status: DC
  Administered 2012-03-02: 09:00:00 via INTRAVENOUS

## 2012-03-02 MED ORDER — FENTANYL CITRATE 0.05 MG/ML IJ SOLN
INTRAMUSCULAR | Status: AC | PRN
  Administered 2012-03-02: 50 ug via INTRAVENOUS

## 2012-03-02 MED ORDER — MIDAZOLAM HCL 2 MG/2ML IJ SOLN
INTRAMUSCULAR | Status: AC
  Filled 2012-03-02: qty 4

## 2012-03-02 MED ORDER — IOHEXOL 300 MG/ML  SOLN
150.0000 mL | Freq: Once | INTRAMUSCULAR | Status: AC | PRN
  Administered 2012-03-02: 50 mL via INTRAVENOUS

## 2012-03-02 MED ORDER — FENTANYL CITRATE 0.05 MG/ML IJ SOLN
INTRAMUSCULAR | Status: AC
  Filled 2012-03-02: qty 2

## 2012-03-02 MED ORDER — MIDAZOLAM HCL 2 MG/2ML IJ SOLN
INTRAMUSCULAR | Status: AC | PRN
  Administered 2012-03-02: 1 mg via INTRAVENOUS

## 2012-03-02 NOTE — H&P (Signed)
Chief Complaint: "I'm here to have my filter removed" Referring Physician:Jones HPI: Michaela Holder is an 61 y.o. 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 She had to have this to protect her while she was off her Coumadin. She is now back on her coumadin and she is scheduled for IVC filter retrieval. She has held her Coumadin for the past 5 days and has been on a Lovenox bridge which she took up until yesterday. She otherwise feels well.  Past Medical History:  Past Medical History  Diagnosis Date  . Hypertension   . GERD (gastroesophageal reflux disease)   . Hx of blood clots     Past Surgical History:  Past Surgical History  Procedure Date  . Abdominal hysterectomy   . Back surgery     lumbar fusion 2007  . Varicose vein surgery   . Ivc  filter     Family History: No family history on file.  Social History:  reports that she has been smoking Cigarettes.  She has a 10 pack-year smoking history. She has never used smokeless tobacco. She reports that she does not drink alcohol or use illicit drugs.  Allergies:  Allergies  Allergen Reactions  . Keflex (Cephalexin) Hives    Medications: Lovenox-stopped 10/17 Allegra Flonase Prinvil Lyrica Zantac B12 Coumadin-held 10/13  Please HPI for pertinent positives, otherwise complete 10 system ROS negative.  Physical Exam: There were no vitals taken for this visit. There is no height or weight on file to calculate BMI.   General Appearance:  Alert, cooperative, no distress, appears stated age, obese  Head:  Normocephalic, without obvious abnormality, atraumatic  ENT: Unremarkable  Neck: Supple, symmetrical, trachea midline, no adenopathy, thyroid: not enlarged, symmetric, no tenderness/mass/nodules  Lungs:   Clear to auscultation bilaterally, no w/r/r, respirations unlabored without use of accessory muscles.  Chest Wall:  No tenderness or deformity  Heart:  Regular rate and rhythm, S1, S2 normal, no  murmur, rub or gallop. Carotids 2+ without bruit.  Abdomen:   Soft, non-tender, non distended. Bowel sounds active all four quadrants,  no masses, no organomegaly.  Neurologic: Normal affect, no gross deficits.   Results for orders placed during the hospital encounter of 03/02/12 (from the past 48 hour(s))  CBC WITH DIFFERENTIAL     Status: Normal   Collection Time   03/02/12  8:48 AM      Component Value Range Comment   WBC 6.2  4.0 - 10.5 K/uL    RBC 4.54  3.87 - 5.11 MIL/uL    Hemoglobin 14.0  12.0 - 15.0 g/dL    HCT 16.1  09.6 - 04.5 %    MCV 91.9  78.0 - 100.0 fL    MCH 30.8  26.0 - 34.0 pg    MCHC 33.6  30.0 - 36.0 g/dL    RDW 40.9  81.1 - 91.4 %    Platelets 225  150 - 400 K/uL    Neutrophils Relative 50  43 - 77 %    Neutro Abs 3.1  1.7 - 7.7 K/uL    Lymphocytes Relative 37  12 - 46 %    Lymphs Abs 2.3  0.7 - 4.0 K/uL    Monocytes Relative 10  3 - 12 %    Monocytes Absolute 0.6  0.1 - 1.0 K/uL    Eosinophils Relative 3  0 - 5 %    Eosinophils Absolute 0.2  0.0 - 0.7 K/uL  Basophils Relative 1  0 - 1 %    Basophils Absolute 0.0  0.0 - 0.1 K/uL    No results found.  Assessment/Plan Hx DVT/PE IVC filter placed 12/08/11 Has now resumed PO Coumadin and requests IVC filter retrieval Reviewed procedure and risks. Labs pending. Consent signed in chart  Brayton El PA-C 03/02/2012, 9:16 AM

## 2012-03-02 NOTE — Procedures (Signed)
Successful removal of an infrarenal IVC filter.  No immediate complications.

## 2012-03-02 NOTE — ED Notes (Signed)
placwed on 2 l/[place]

## 2012-03-02 NOTE — ED Notes (Signed)
Discharge home via w/c with spouse reviewed instructions

## 2012-03-05 ENCOUNTER — Telehealth (HOSPITAL_COMMUNITY): Payer: Self-pay | Admitting: *Deleted

## 2012-03-05 NOTE — Telephone Encounter (Signed)
[facility] post IVC follow up.  Pt doing well, no problems or questions.

## 2012-04-11 ENCOUNTER — Other Ambulatory Visit: Payer: Self-pay | Admitting: Neurological Surgery

## 2012-04-11 DIAGNOSIS — M545 Low back pain, unspecified: Secondary | ICD-10-CM

## 2012-04-13 ENCOUNTER — Ambulatory Visit
Admission: RE | Admit: 2012-04-13 | Discharge: 2012-04-13 | Disposition: A | Discharge status: Home / Self Care | Payer: 59 | Source: Ambulatory Visit | Attending: Neurological Surgery | Admitting: Neurological Surgery

## 2012-04-13 DIAGNOSIS — M545 Low back pain, unspecified: Secondary | ICD-10-CM

## 2015-08-25 ENCOUNTER — Other Ambulatory Visit: Payer: Self-pay | Admitting: Neurological Surgery

## 2015-08-25 DIAGNOSIS — M48061 Spinal stenosis, lumbar region without neurogenic claudication: Secondary | ICD-10-CM

## 2015-08-31 ENCOUNTER — Ambulatory Visit
Admission: RE | Admit: 2015-08-31 | Discharge: 2015-08-31 | Disposition: A | Discharge status: Home / Self Care | Payer: Medicare HMO | Source: Ambulatory Visit | Attending: Neurological Surgery | Admitting: Neurological Surgery

## 2015-08-31 DIAGNOSIS — M48061 Spinal stenosis, lumbar region without neurogenic claudication: Secondary | ICD-10-CM

## 2015-09-01 ENCOUNTER — Other Ambulatory Visit: Payer: Self-pay | Admitting: Neurological Surgery

## 2015-09-22 ENCOUNTER — Encounter (HOSPITAL_COMMUNITY)
Admission: RE | Admit: 2015-09-22 | Discharge: 2015-09-22 | Disposition: A | Discharge status: Home / Self Care | Payer: Medicare HMO | Source: Ambulatory Visit | Attending: Neurological Surgery | Admitting: Neurological Surgery

## 2015-09-22 ENCOUNTER — Encounter (HOSPITAL_COMMUNITY): Payer: Self-pay

## 2015-09-22 DIAGNOSIS — R001 Bradycardia, unspecified: Secondary | ICD-10-CM | POA: Insufficient documentation

## 2015-09-22 DIAGNOSIS — I10 Essential (primary) hypertension: Secondary | ICD-10-CM | POA: Insufficient documentation

## 2015-09-22 DIAGNOSIS — Z7984 Long term (current) use of oral hypoglycemic drugs: Secondary | ICD-10-CM | POA: Insufficient documentation

## 2015-09-22 DIAGNOSIS — Z01818 Encounter for other preprocedural examination: Secondary | ICD-10-CM | POA: Diagnosis not present

## 2015-09-22 DIAGNOSIS — Z0183 Encounter for blood typing: Secondary | ICD-10-CM | POA: Insufficient documentation

## 2015-09-22 DIAGNOSIS — R918 Other nonspecific abnormal finding of lung field: Secondary | ICD-10-CM | POA: Insufficient documentation

## 2015-09-22 DIAGNOSIS — Z01812 Encounter for preprocedural laboratory examination: Secondary | ICD-10-CM | POA: Diagnosis not present

## 2015-09-22 DIAGNOSIS — E119 Type 2 diabetes mellitus without complications: Secondary | ICD-10-CM | POA: Diagnosis not present

## 2015-09-22 DIAGNOSIS — E039 Hypothyroidism, unspecified: Secondary | ICD-10-CM | POA: Insufficient documentation

## 2015-09-22 DIAGNOSIS — M4310 Spondylolisthesis, site unspecified: Secondary | ICD-10-CM | POA: Insufficient documentation

## 2015-09-22 DIAGNOSIS — Z79899 Other long term (current) drug therapy: Secondary | ICD-10-CM | POA: Diagnosis not present

## 2015-09-22 DIAGNOSIS — K219 Gastro-esophageal reflux disease without esophagitis: Secondary | ICD-10-CM | POA: Insufficient documentation

## 2015-09-22 HISTORY — DX: Unspecified osteoarthritis, unspecified site: M19.90

## 2015-09-22 HISTORY — DX: Peripheral vascular disease, unspecified: I73.9

## 2015-09-22 HISTORY — DX: Type 2 diabetes mellitus without complications: E11.9

## 2015-09-22 HISTORY — DX: Hypothyroidism, unspecified: E03.9

## 2015-09-22 LAB — GLUCOSE, CAPILLARY: GLUCOSE-CAPILLARY: 67 mg/dL (ref 65–99)

## 2015-09-22 LAB — TYPE AND SCREEN
ABO/RH(D): A POS
Antibody Screen: NEGATIVE

## 2015-09-22 LAB — CBC WITH DIFFERENTIAL/PLATELET
BASOS ABS: 0 10*3/uL (ref 0.0–0.1)
BASOS PCT: 1 %
Eosinophils Absolute: 0.2 10*3/uL (ref 0.0–0.7)
Eosinophils Relative: 3 %
HEMATOCRIT: 45.1 % (ref 36.0–46.0)
HEMOGLOBIN: 14.9 g/dL (ref 12.0–15.0)
Lymphocytes Relative: 39 %
Lymphs Abs: 2.7 10*3/uL (ref 0.7–4.0)
MCH: 30.7 pg (ref 26.0–34.0)
MCHC: 33 g/dL (ref 30.0–36.0)
MCV: 93 fL (ref 78.0–100.0)
Monocytes Absolute: 0.4 10*3/uL (ref 0.1–1.0)
Monocytes Relative: 6 %
NEUTROS PCT: 51 %
Neutro Abs: 3.7 10*3/uL (ref 1.7–7.7)
Platelets: 274 10*3/uL (ref 150–400)
RBC: 4.85 MIL/uL (ref 3.87–5.11)
RDW: 13.1 % (ref 11.5–15.5)
WBC: 7 10*3/uL (ref 4.0–10.5)

## 2015-09-22 LAB — BASIC METABOLIC PANEL
Anion gap: 11 (ref 5–15)
BUN: 12 mg/dL (ref 6–20)
CALCIUM: 9.7 mg/dL (ref 8.9–10.3)
CO2: 28 mmol/L (ref 22–32)
CREATININE: 0.63 mg/dL (ref 0.44–1.00)
Chloride: 102 mmol/L (ref 101–111)
Glucose, Bld: 86 mg/dL (ref 65–99)
Potassium: 4.3 mmol/L (ref 3.5–5.1)
SODIUM: 141 mmol/L (ref 135–145)

## 2015-09-22 LAB — PROTIME-INR
INR: 0.97 (ref 0.00–1.49)
Prothrombin Time: 13.1 seconds (ref 11.6–15.2)

## 2015-09-22 LAB — SURGICAL PCR SCREEN
MRSA, PCR: NEGATIVE
Staphylococcus aureus: NEGATIVE

## 2015-09-22 NOTE — Pre-Procedure Instructions (Signed)
Michaela Holder  09/22/2015      [id number] - [address], [address] [place] [address] Phone: [phone] Fax: [phone]    Your procedure is scheduled on  Wednesday  09/30/15  Report to [facility] Admitting at 1250 P.M.  Call this number if you have problems the morning of surgery:  [phone]   Remember:  Do not eat food or drink liquids after midnight.  Take these medicines the morning of surgery with A SIP OF WATER   GABAPENTIN, HYDROCODONE IF NEEDED, RANITIDINE (ZANTAC)      (NO ASPIRIN OR ASPIRIN PRODUCTS, IBUPROFEN/ ADVIL/ MOTRIN, GOODYS, BCS, HERBAL MEDICINES)   How to Manage Your Diabetes Before and After Surgery  Why is it important to control my blood sugar before and after surgery? . Improving blood sugar levels before and after surgery helps healing and can limit problems. . A way of improving blood sugar control is eating a healthy diet by: o  Eating less sugar and carbohydrates o  Increasing activity/exercise o  Talking with your doctor about reaching your blood sugar goals . High blood sugars (greater than 180 mg/dL) can raise your risk of infections and slow your recovery, so you will need to focus on controlling your diabetes during the weeks before surgery. . Make sure that the doctor who takes care of your diabetes knows about your planned surgery including the date and location.  How do I manage my blood sugar before surgery? . Check your blood sugar at least 4 times a day, starting 2 days before surgery, to make sure that the level is not too high or low. o Check your blood sugar the morning of your surgery when you wake up and every 2 hours until you get to the Short Stay unit. . If your blood sugar is less than 70 mg/dL, you will need to treat for low blood sugar: o Do not take insulin. o Treat a low blood sugar (less than 70 mg/dL) with  cup of clear juice (cranberry or apple), 4 glucose  tablets, OR glucose gel. o Recheck blood sugar in 15 minutes after treatment (to make sure it is greater than 70 mg/dL). If your blood sugar is not greater than 70 mg/dL on recheck, call [phone] for further instructions. . Report your blood sugar to the short stay nurse when you get to Short Stay.  . If you are admitted to the hospital after surgery: o Your blood sugar will be checked by the staff and you will probably be given insulin after surgery (instead of oral diabetes medicines) to make sure you have good blood sugar levels. o The goal for blood sugar control after surgery is 80-180 mg/dL.              WHAT DO I DO ABOUT MY DIABETES MEDICATION?   Marland Kitchen Do not take oral diabetes medicines (pills) the morning of surgery.  . The day of surgery, do not take other diabetes injectables, including Byetta (exenatide), Bydureon (exenatide ER), Victoza (liraglutide), or Trulicity (dulaglutide).   Other Instructions:          Patient Signature:  Date:   Nurse Signature:  Date:   Reviewed and Endorsed by [facility] Patient Education Committee, August 2015  Do not wear jewelry, make-up or nail polish.  Do not wear lotions, powders, or perfumes.  You may wear deodorant.  Do not shave 48 hours prior to surgery.  Men may  shave face and neck.  Do not bring valuables to the hospital.  [facility] is not responsible for any belongings or valuables.  Contacts, dentures or bridgework may not be worn into surgery.  Leave your suitcase in the car.  After surgery it may be brought to your room.  For patients admitted to the hospital, discharge time will be determined by your treatment team.  Patients discharged the day of surgery will not be allowed to drive home.   Name and phone number of your driver:    Special instructions:  SEE PREPARING FOR SURGERY  Please read over the following fact sheets that you were given. Pain Booklet, Coughing and Deep Breathing, Blood  Transfusion Information, MRSA Information and Surgical Site Infection Prevention

## 2015-09-23 LAB — HEMOGLOBIN A1C
HEMOGLOBIN A1C: 7.3 % — AB (ref 4.8–5.6)
MEAN PLASMA GLUCOSE: 163 mg/dL

## 2015-09-23 NOTE — Progress Notes (Signed)
Anesthesia Chart Review:  Pt is a 65 year old female scheduled for L2-3, L3-4 maximum access PLIF on 09/30/2015 with Dr. Ronnald Ramp.   PCP is Dr. Charline Bills (care everywhere)  PMH includes:  HTN, DM, PVD, hypothyroidism, GERD. Current smoker. BMI 29.5  Medications include: lisinopril, metformin, zantac  Chest x-ray 09/22/15 reviewed.  - Increasing nodular density along the right hemidiaphragm. While this could reflect scarring or early infiltrate, I cannot exclude pulmonary nodule. Recommend further evaluation with chest CT. - Notified Lorriane Shire in Dr. Ronnald Ramp' office of need for CT.   EKG 09/22/15: sinus bradycardia (53 bpm)  If no changes, I anticipate pt can proceed with surgery as scheduled.   Willeen Cass, FNP-BC [facility]Anesthesiology Phone: [phone] 09/23/2015 11:59 AM

## 2015-09-29 MED ORDER — VANCOMYCIN HCL IN DEXTROSE 1-5 GM/200ML-% IV SOLN
1000.0000 mg | INTRAVENOUS | Status: AC
  Administered 2015-09-30: 1000 mg via INTRAVENOUS
  Filled 2015-09-29: qty 200

## 2015-09-29 MED ORDER — DEXAMETHASONE SODIUM PHOSPHATE 10 MG/ML IJ SOLN
10.0000 mg | INTRAMUSCULAR | Status: DC
  Filled 2015-09-29: qty 1

## 2015-09-30 ENCOUNTER — Inpatient Hospital Stay (HOSPITAL_COMMUNITY): Payer: Medicare HMO | Admitting: Emergency Medicine

## 2015-09-30 ENCOUNTER — Inpatient Hospital Stay (HOSPITAL_COMMUNITY): Payer: Medicare HMO | Admitting: Critical Care Medicine

## 2015-09-30 ENCOUNTER — Encounter (HOSPITAL_COMMUNITY)
Admission: RE | Disposition: A | Discharge status: Home / Self Care | Payer: Self-pay | Source: Ambulatory Visit | Attending: Neurological Surgery

## 2015-09-30 ENCOUNTER — Inpatient Hospital Stay (HOSPITAL_COMMUNITY): Payer: Medicare HMO

## 2015-09-30 ENCOUNTER — Inpatient Hospital Stay (HOSPITAL_COMMUNITY)
Admission: RE | Admit: 2015-09-30 | Discharge: 2015-10-02 | DRG: 460 | Disposition: A | Discharge status: Home / Self Care | Payer: Medicare HMO | Source: Ambulatory Visit | Attending: Neurological Surgery | Admitting: Neurological Surgery

## 2015-09-30 ENCOUNTER — Encounter (HOSPITAL_COMMUNITY): Payer: Self-pay | Admitting: Anesthesiology

## 2015-09-30 DIAGNOSIS — Z79899 Other long term (current) drug therapy: Secondary | ICD-10-CM

## 2015-09-30 DIAGNOSIS — M47896 Other spondylosis, lumbar region: Secondary | ICD-10-CM | POA: Diagnosis present

## 2015-09-30 DIAGNOSIS — Z981 Arthrodesis status: Secondary | ICD-10-CM

## 2015-09-30 DIAGNOSIS — M1990 Unspecified osteoarthritis, unspecified site: Secondary | ICD-10-CM | POA: Diagnosis present

## 2015-09-30 DIAGNOSIS — I739 Peripheral vascular disease, unspecified: Secondary | ICD-10-CM | POA: Diagnosis present

## 2015-09-30 DIAGNOSIS — K219 Gastro-esophageal reflux disease without esophagitis: Secondary | ICD-10-CM | POA: Diagnosis present

## 2015-09-30 DIAGNOSIS — Z888 Allergy status to other drugs, medicaments and biological substances status: Secondary | ICD-10-CM | POA: Diagnosis not present

## 2015-09-30 DIAGNOSIS — R9389 Abnormal findings on diagnostic imaging of other specified body structures: Secondary | ICD-10-CM

## 2015-09-30 DIAGNOSIS — Z881 Allergy status to other antibiotic agents status: Secondary | ICD-10-CM | POA: Diagnosis not present

## 2015-09-30 DIAGNOSIS — Z419 Encounter for procedure for purposes other than remedying health state, unspecified: Secondary | ICD-10-CM

## 2015-09-30 DIAGNOSIS — F17210 Nicotine dependence, cigarettes, uncomplicated: Secondary | ICD-10-CM | POA: Diagnosis present

## 2015-09-30 DIAGNOSIS — M79606 Pain in leg, unspecified: Secondary | ICD-10-CM | POA: Diagnosis present

## 2015-09-30 DIAGNOSIS — E039 Hypothyroidism, unspecified: Secondary | ICD-10-CM | POA: Diagnosis present

## 2015-09-30 DIAGNOSIS — E119 Type 2 diabetes mellitus without complications: Secondary | ICD-10-CM | POA: Diagnosis present

## 2015-09-30 DIAGNOSIS — M4806 Spinal stenosis, lumbar region: Secondary | ICD-10-CM | POA: Diagnosis present

## 2015-09-30 DIAGNOSIS — M4316 Spondylolisthesis, lumbar region: Secondary | ICD-10-CM | POA: Diagnosis present

## 2015-09-30 DIAGNOSIS — I10 Essential (primary) hypertension: Secondary | ICD-10-CM | POA: Diagnosis present

## 2015-09-30 DIAGNOSIS — Z7984 Long term (current) use of oral hypoglycemic drugs: Secondary | ICD-10-CM

## 2015-09-30 HISTORY — PX: MAXIMUM ACCESS (MAS)POSTERIOR LUMBAR INTERBODY FUSION (PLIF) 2 LEVEL: SHX6369

## 2015-09-30 LAB — GLUCOSE, CAPILLARY
GLUCOSE-CAPILLARY: 123 mg/dL — AB (ref 65–99)
GLUCOSE-CAPILLARY: 158 mg/dL — AB (ref 65–99)
Glucose-Capillary: 123 mg/dL — ABNORMAL HIGH (ref 65–99)

## 2015-09-30 SURGERY — FOR MAXIMUM ACCESS (MAS) POSTERIOR LUMBAR INTERBODY FUSION (PLIF) 2 LEVEL
Anesthesia: General | Site: Back

## 2015-09-30 MED ORDER — VANCOMYCIN HCL 1000 MG IV SOLR
INTRAVENOUS | Status: AC
  Filled 2015-09-30: qty 1000

## 2015-09-30 MED ORDER — OXYCODONE-ACETAMINOPHEN 5-325 MG PO TABS
1.0000 | ORAL_TABLET | ORAL | Status: DC | PRN
  Administered 2015-09-30 – 2015-10-02 (×6): 2 via ORAL
  Filled 2015-09-30 (×6): qty 2

## 2015-09-30 MED ORDER — LISINOPRIL 5 MG PO TABS
5.0000 mg | ORAL_TABLET | Freq: Every day | ORAL | Status: DC
  Administered 2015-09-30 – 2015-10-01 (×2): 5 mg via ORAL
  Filled 2015-09-30 (×2): qty 1

## 2015-09-30 MED ORDER — EPHEDRINE 5 MG/ML INJ
INTRAVENOUS | Status: AC
  Filled 2015-09-30: qty 10

## 2015-09-30 MED ORDER — LIDOCAINE HCL (CARDIAC) 20 MG/ML IV SOLN
INTRAVENOUS | Status: DC | PRN
  Administered 2015-09-30: 80 mg via INTRAVENOUS

## 2015-09-30 MED ORDER — VANCOMYCIN HCL 10 G IV SOLR
1250.0000 mg | Freq: Once | INTRAVENOUS | Status: AC
  Administered 2015-09-30: 1250 mg via INTRAVENOUS
  Filled 2015-09-30: qty 1250

## 2015-09-30 MED ORDER — HYDROMORPHONE HCL 1 MG/ML IJ SOLN
INTRAMUSCULAR | Status: AC
  Administered 2015-09-30: 0.5 mg via INTRAVENOUS
  Filled 2015-09-30: qty 1

## 2015-09-30 MED ORDER — METHOCARBAMOL 1000 MG/10ML IJ SOLN
500.0000 mg | Freq: Four times a day (QID) | INTRAVENOUS | Status: DC | PRN
  Filled 2015-09-30: qty 5

## 2015-09-30 MED ORDER — INSULIN ASPART 100 UNIT/ML ~~LOC~~ SOLN
0.0000 [IU] | Freq: Three times a day (TID) | SUBCUTANEOUS | Status: DC
  Administered 2015-10-01: 3 [IU] via SUBCUTANEOUS
  Administered 2015-10-01 – 2015-10-02 (×3): 2 [IU] via SUBCUTANEOUS

## 2015-09-30 MED ORDER — ONDANSETRON HCL 4 MG/2ML IJ SOLN: 4.0000 mg | INTRAMUSCULAR | Status: DC | PRN

## 2015-09-30 MED ORDER — FLUTICASONE PROPIONATE 50 MCG/ACT NA SUSP
2.0000 | Freq: Every day | NASAL | Status: DC
  Administered 2015-10-01 – 2015-10-02 (×2): 2 via NASAL
  Filled 2015-09-30: qty 16

## 2015-09-30 MED ORDER — ONDANSETRON HCL 4 MG/2ML IJ SOLN
INTRAMUSCULAR | Status: DC | PRN
Start: 2015-09-30 — End: 2015-09-30
  Administered 2015-09-30: 4 mg via INTRAVENOUS

## 2015-09-30 MED ORDER — MIDAZOLAM HCL 2 MG/2ML IJ SOLN
INTRAMUSCULAR | Status: AC
  Filled 2015-09-30: qty 2

## 2015-09-30 MED ORDER — 0.9 % SODIUM CHLORIDE (POUR BTL) OPTIME
TOPICAL | Status: DC | PRN
  Administered 2015-09-30: 1000 mL

## 2015-09-30 MED ORDER — ONDANSETRON HCL 4 MG/2ML IJ SOLN: 4.0000 mg | Freq: Once | INTRAMUSCULAR | Status: DC | PRN

## 2015-09-30 MED ORDER — METFORMIN HCL ER 500 MG PO TB24
500.0000 mg | ORAL_TABLET | Freq: Two times a day (BID) | ORAL | Status: DC
  Administered 2015-10-01 – 2015-10-02 (×3): 500 mg via ORAL
  Filled 2015-09-30 (×3): qty 1

## 2015-09-30 MED ORDER — VANCOMYCIN HCL 1000 MG IV SOLR
INTRAVENOUS | Status: DC | PRN
  Administered 2015-09-30: 1000 mg via TOPICAL

## 2015-09-30 MED ORDER — ONDANSETRON HCL 4 MG/2ML IJ SOLN
INTRAMUSCULAR | Status: AC
  Filled 2015-09-30: qty 2

## 2015-09-30 MED ORDER — THROMBIN 20000 UNITS EX SOLR
CUTANEOUS | Status: DC | PRN
  Administered 2015-09-30: 20 mL via TOPICAL

## 2015-09-30 MED ORDER — METHOCARBAMOL 500 MG PO TABS
500.0000 mg | ORAL_TABLET | Freq: Four times a day (QID) | ORAL | Status: DC | PRN
  Administered 2015-09-30 – 2015-10-01 (×3): 500 mg via ORAL
  Filled 2015-09-30 (×2): qty 1

## 2015-09-30 MED ORDER — GABAPENTIN 300 MG PO CAPS
300.0000 mg | ORAL_CAPSULE | Freq: Every day | ORAL | Status: DC
  Administered 2015-09-30 – 2015-10-01 (×2): 300 mg via ORAL
  Filled 2015-09-30 (×2): qty 1

## 2015-09-30 MED ORDER — THROMBIN 5000 UNITS EX SOLR
OROMUCOSAL | Status: DC | PRN
  Administered 2015-09-30: 10 mL via TOPICAL

## 2015-09-30 MED ORDER — MENTHOL 3 MG MT LOZG
1.0000 | LOZENGE | OROMUCOSAL | Status: DC | PRN
Start: 2015-09-30 — End: 2015-10-02

## 2015-09-30 MED ORDER — EPHEDRINE SULFATE 50 MG/ML IJ SOLN
INTRAMUSCULAR | Status: DC | PRN
  Administered 2015-09-30 (×4): 5 mg via INTRAVENOUS

## 2015-09-30 MED ORDER — MORPHINE SULFATE (PF) 2 MG/ML IV SOLN
1.0000 mg | INTRAVENOUS | Status: DC | PRN
  Administered 2015-09-30: 2 mg via INTRAVENOUS
  Administered 2015-10-01: 4 mg via INTRAVENOUS
  Administered 2015-10-01 (×3): 2 mg via INTRAVENOUS
  Filled 2015-09-30 (×4): qty 1
  Filled 2015-09-30: qty 2

## 2015-09-30 MED ORDER — GLYCOPYRROLATE 0.2 MG/ML IV SOSY
PREFILLED_SYRINGE | INTRAVENOUS | Status: AC
  Filled 2015-09-30: qty 9

## 2015-09-30 MED ORDER — POTASSIUM CHLORIDE IN NACL 20-0.9 MEQ/L-% IV SOLN
INTRAVENOUS | Status: DC
  Administered 2015-10-01: 75 mL/h via INTRAVENOUS
  Administered 2015-10-01: 75 mL via INTRAVENOUS
  Filled 2015-09-30 (×2): qty 1000

## 2015-09-30 MED ORDER — FENTANYL CITRATE (PF) 250 MCG/5ML IJ SOLN
INTRAMUSCULAR | Status: AC
  Filled 2015-09-30: qty 5

## 2015-09-30 MED ORDER — SUCCINYLCHOLINE CHLORIDE 20 MG/ML IJ SOLN
INTRAMUSCULAR | Status: DC | PRN
  Administered 2015-09-30: 100 mg via INTRAVENOUS

## 2015-09-30 MED ORDER — ARTIFICIAL TEARS OP OINT
TOPICAL_OINTMENT | OPHTHALMIC | Status: AC
  Filled 2015-09-30: qty 3.5

## 2015-09-30 MED ORDER — MIDAZOLAM HCL 5 MG/5ML IJ SOLN
INTRAMUSCULAR | Status: DC | PRN
  Administered 2015-09-30: 2 mg via INTRAVENOUS

## 2015-09-30 MED ORDER — SODIUM CHLORIDE 0.9% FLUSH: 3.0000 mL | INTRAVENOUS | Status: DC | PRN

## 2015-09-30 MED ORDER — ACETAMINOPHEN 325 MG PO TABS: 650.0000 mg | ORAL_TABLET | ORAL | Status: DC | PRN

## 2015-09-30 MED ORDER — PHENOL 1.4 % MT LIQD: 1.0000 | OROMUCOSAL | Status: DC | PRN

## 2015-09-30 MED ORDER — ARTIFICIAL TEARS OP OINT
TOPICAL_OINTMENT | OPHTHALMIC | Status: DC | PRN
Start: 2015-09-30 — End: 2015-09-30
  Administered 2015-09-30: 1 via OPHTHALMIC

## 2015-09-30 MED ORDER — LORATADINE 10 MG PO TABS
10.0000 mg | ORAL_TABLET | Freq: Every day | ORAL | Status: DC
  Administered 2015-10-01 – 2015-10-02 (×2): 10 mg via ORAL
  Filled 2015-09-30 (×2): qty 1

## 2015-09-30 MED ORDER — PROPOFOL 10 MG/ML IV BOLUS
INTRAVENOUS | Status: DC | PRN
  Administered 2015-09-30: 100 mg via INTRAVENOUS
  Administered 2015-09-30 (×2): 40 mg via INTRAVENOUS

## 2015-09-30 MED ORDER — OXYCODONE-ACETAMINOPHEN 5-325 MG PO TABS
ORAL_TABLET | ORAL | Status: AC
  Filled 2015-09-30: qty 2

## 2015-09-30 MED ORDER — HYDROMORPHONE HCL 1 MG/ML IJ SOLN
0.2500 mg | INTRAMUSCULAR | Status: DC | PRN
  Administered 2015-09-30 (×4): 0.5 mg via INTRAVENOUS

## 2015-09-30 MED ORDER — METHOCARBAMOL 500 MG PO TABS
ORAL_TABLET | ORAL | Status: AC
  Filled 2015-09-30: qty 1

## 2015-09-30 MED ORDER — MEPERIDINE HCL 25 MG/ML IJ SOLN: 6.2500 mg | INTRAMUSCULAR | Status: DC | PRN

## 2015-09-30 MED ORDER — ACETAMINOPHEN 650 MG RE SUPP: 650.0000 mg | RECTAL | Status: DC | PRN

## 2015-09-30 MED ORDER — FENTANYL CITRATE (PF) 100 MCG/2ML IJ SOLN
INTRAMUSCULAR | Status: DC | PRN
  Administered 2015-09-30: 25 ug via INTRAVENOUS
  Administered 2015-09-30: 150 ug via INTRAVENOUS
  Administered 2015-09-30 (×3): 50 ug via INTRAVENOUS
  Administered 2015-09-30: 25 ug via INTRAVENOUS

## 2015-09-30 MED ORDER — LACTATED RINGERS IV SOLN
INTRAVENOUS | Status: DC
  Administered 2015-09-30 (×2): via INTRAVENOUS

## 2015-09-30 MED ORDER — SODIUM CHLORIDE 0.9 % IV SOLN: 250.0000 mL | INTRAVENOUS | Status: DC

## 2015-09-30 MED ORDER — SODIUM CHLORIDE 0.9% FLUSH
3.0000 mL | Freq: Two times a day (BID) | INTRAVENOUS | Status: DC
  Administered 2015-09-30: 3 mL via INTRAVENOUS

## 2015-09-30 MED ORDER — CELECOXIB 200 MG PO CAPS
200.0000 mg | ORAL_CAPSULE | Freq: Two times a day (BID) | ORAL | Status: DC
  Administered 2015-09-30 – 2015-10-01 (×3): 200 mg via ORAL
  Filled 2015-09-30 (×4): qty 1

## 2015-09-30 SURGICAL SUPPLY — 64 items
ADH SKN CLS APL DERMABOND .7 (GAUZE/BANDAGES/DRESSINGS) ×1
APL SKNCLS STERI-STRIP NONHPOA (GAUZE/BANDAGES/DRESSINGS) ×1
BAG DECANTER FOR FLEXI CONT (MISCELLANEOUS) ×3 IMPLANT
BENZOIN TINCTURE PRP APPL 2/3 (GAUZE/BANDAGES/DRESSINGS) ×3 IMPLANT
BIT DRILL PLIF MAS 5.0MM DISP (DRILL) IMPLANT
BLADE CLIPPER SURG (BLADE) IMPLANT
BONE MATRIX OSTEOCEL PRO MED (Bone Implant) ×2 IMPLANT
BONE MATRIX OSTEOCEL PRO SM (Bone Implant) ×4 IMPLANT
BUR MATCHSTICK NEURO 3.0 LAGG (BURR) ×3 IMPLANT
CAGE COROENT MP 8X23 (Cage) ×8 IMPLANT
CANISTER SUCT 3000ML PPV (MISCELLANEOUS) ×3 IMPLANT
CLIP NEUROVISION LG (CLIP) ×2 IMPLANT
CLOSURE WOUND 1/2 X4 (GAUZE/BANDAGES/DRESSINGS) ×2
CONT SPEC 4OZ CLIKSEAL STRL BL (MISCELLANEOUS) ×3 IMPLANT
COVER BACK TABLE 24X17X13 BIG (DRAPES) IMPLANT
COVER BACK TABLE 60X90IN (DRAPES) ×3 IMPLANT
DERMABOND ADVANCED (GAUZE/BANDAGES/DRESSINGS) ×2
DERMABOND ADVANCED .7 DNX12 (GAUZE/BANDAGES/DRESSINGS) IMPLANT
DRAPE C-ARM 42X72 X-RAY (DRAPES) ×3 IMPLANT
DRAPE C-ARMOR (DRAPES) ×3 IMPLANT
DRAPE LAPAROTOMY 100X72X124 (DRAPES) ×3 IMPLANT
DRAPE POUCH INSTRU U-SHP 10X18 (DRAPES) ×3 IMPLANT
DRAPE SURG 17X23 STRL (DRAPES) ×3 IMPLANT
DRILL PLIF MAS 5.0MM DISP (DRILL) ×3
DRSG OPSITE POSTOP 4X8 (GAUZE/BANDAGES/DRESSINGS) ×2 IMPLANT
DURAPREP 26ML APPLICATOR (WOUND CARE) ×3 IMPLANT
ELECT REM PT RETURN 9FT ADLT (ELECTROSURGICAL) ×3
ELECTRODE REM PT RTRN 9FT ADLT (ELECTROSURGICAL) ×1 IMPLANT
EVACUATOR 1/8 PVC DRAIN (DRAIN) ×3 IMPLANT
GAUZE SPONGE 4X4 16PLY XRAY LF (GAUZE/BANDAGES/DRESSINGS) IMPLANT
GLOVE BIO SURGEON STRL SZ8 (GLOVE) ×6 IMPLANT
GOWN STRL REUS W/ TWL LRG LVL3 (GOWN DISPOSABLE) IMPLANT
GOWN STRL REUS W/ TWL XL LVL3 (GOWN DISPOSABLE) ×2 IMPLANT
GOWN STRL REUS W/TWL 2XL LVL3 (GOWN DISPOSABLE) IMPLANT
GOWN STRL REUS W/TWL LRG LVL3 (GOWN DISPOSABLE)
GOWN STRL REUS W/TWL XL LVL3 (GOWN DISPOSABLE) ×6
HEMOSTAT POWDER KIT SURGIFOAM (HEMOSTASIS) IMPLANT
KIT BASIN OR (CUSTOM PROCEDURE TRAY) ×3 IMPLANT
KIT ROOM TURNOVER OR (KITS) ×3 IMPLANT
MILL MEDIUM DISP (BLADE) IMPLANT
MODULE NVM5 NEXT GEN EMG (NEEDLE) ×2 IMPLANT
NDL HYPO 25X1 1.5 SAFETY (NEEDLE) ×1 IMPLANT
NEEDLE HYPO 25X1 1.5 SAFETY (NEEDLE) ×3 IMPLANT
NS IRRIG 1000ML POUR BTL (IV SOLUTION) ×3 IMPLANT
PACK LAMINECTOMY NEURO (CUSTOM PROCEDURE TRAY) ×3 IMPLANT
PAD ARMBOARD 7.5X6 YLW CONV (MISCELLANEOUS) ×9 IMPLANT
ROD PLIF MAS 65MM (Rod) ×4 IMPLANT
SCREW LOCK (Screw) ×18 IMPLANT
SCREW LOCK FXNS SPNE MAS PL (Screw) IMPLANT
SCREW PLIF MAS 5.0X35 (Screw) ×4 IMPLANT
SCREW SHANK 5.0X35 (Screw) ×8 IMPLANT
SCREW TULIP 5.5 (Screw) ×8 IMPLANT
SPONGE LAP 4X18 X RAY DECT (DISPOSABLE) IMPLANT
SPONGE SURGIFOAM ABS GEL 100 (HEMOSTASIS) ×3 IMPLANT
STRIP CLOSURE SKIN 1/2X4 (GAUZE/BANDAGES/DRESSINGS) ×4 IMPLANT
SUT VIC AB 0 CT1 18XCR BRD8 (SUTURE) ×1 IMPLANT
SUT VIC AB 0 CT1 8-18 (SUTURE) ×3
SUT VIC AB 2-0 CP2 18 (SUTURE) ×3 IMPLANT
SUT VIC AB 3-0 SH 8-18 (SUTURE) ×6 IMPLANT
SYR 3ML LL SCALE MARK (SYRINGE) IMPLANT
TOWEL OR 17X24 6PK STRL BLUE (TOWEL DISPOSABLE) ×3 IMPLANT
TOWEL OR 17X26 10 PK STRL BLUE (TOWEL DISPOSABLE) ×3 IMPLANT
TRAY FOLEY W/METER SILVER 14FR (SET/KITS/TRAYS/PACK) ×3 IMPLANT
WATER STERILE IRR 1000ML POUR (IV SOLUTION) ×3 IMPLANT

## 2015-09-30 NOTE — Op Note (Signed)
09/30/2015  3:59 PM  PATIENT:  Michaela Holder  65 y.o. 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 Decompressive lumbar laminectomy L2-3 and L3-4 requiring more work than would be required for a simple exposure of the disk for PLIF in order to adequately decompress the neural elements and address the spinal stenosis 2. Posterior lumbar interbody fusion L2-3 and L3-4 using PEEK interbody cages packed with morcellized allograft and autograft 3. Posterior fixation L2-L4 inclusive using cortical pedicle screws.    SURGEON:  Sherley Bounds, MD  ASSISTANTS: Dr. Sherwood Gambler  ANESTHESIA:  General  EBL: 200 ml  Total I/O In: 1600 [I.V.:1600] Out: 400 [Urine:200; Blood:200]  BLOOD ADMINISTERED:none  DRAINS: None  INDICATION FOR PROCEDURE: This patient presented with severe back pain with leg pain. She tried medical management without relief. She had a CT scan which showed adjacent level spondylosis with stenosis above her previous L4-S1 fusion. She had a spondylolisthesis at L3-4. Iliopsoas. I recommended decompression and instrumented fusion. Her pain was debilitating. Patient understood the risks, benefits, and alternatives and potential outcomes and wished to proceed.  PROCEDURE DETAILS:  The patient was brought to the operating room. After induction of generalized endotracheal anesthesia the patient was rolled into the prone position on chest rolls and all pressure points were padded. The patient's lumbar region was cleaned and then prepped with DuraPrep and draped in the usual sterile fashion. Anesthesia was injected and then a dorsal midline incision was made and carried down to the lumbosacral fascia. The fascia was opened and the paraspinous musculature was taken down in a subperiosteal fashion to expose L2-3 and L3-4. A self-retaining retractor was  placed. Intraoperative fluoroscopy confirmed my level, and I started with placement of the L2 and L3 cortical pedicle screws. The pedicle screw entry zones were identified utilizing surface landmarks and  AP and lateral fluoroscopy. I scored the cortex with the high-speed drill and then used the hand drill and EMG monitoring to drill an upward and outward direction into the pedicle. I then tapped line to line, and the tap was also monitored. I then placed a 5-0 x 35 mm cortical pedicle screw into the pedicles of L2 and L3 bilaterally. I then turned my attention to the decompression and the spinous process was removed and complete lumbar laminectomies, hemi- facetectomies, and foraminotomies were performed at L2-3 and L3-4. The patient had significant spinal stenosis and this required more work than would be required for a simple exposure of the disc for posterior lumbar interbody fusion. Much more generous decompression was undertaken in order to adequately decompress the neural elements and address the patient's leg pain. The yellow ligament was removed to expose the underlying dura and nerve roots, and generous foraminotomies were performed to adequately decompress the neural elements. Both the exiting and traversing nerve roots were decompressed on both sides until a coronary dilator passed easily along the nerve roots. Once the decompression was complete, I turned my attention to the posterior lower lumbar interbody fusion. The epidural venous vasculature was coagulated and cut sharply. Disc space was incised and the initial discectomy was performed with pituitary rongeurs. The disc space was distracted with sequential distractors to a height of 8 mm at both levels. We then used a series of scrapers and shavers to prepare the endplates for fusion. The midline was prepared with Epstein curettes. Once the complete discectomy was  finished, we packed an appropriate sized peek interbody cage with local autograft and  morcellized allograft, gently retracted the nerve root, and tapped the cage into position at L2-3 and L3-4.  The midline between the cages was packed with morselized autograft and allograft. We then turned our attention to the placement of the lower pedicle screws. The pedicle screw entry zones were identified utilizing surface landmarks and fluoroscopy. I drilled into each pedicle utilizing the hand drill and EMG monitoring, and tapped each pedicle with the appropriate tap. We palpated with a ball probe to assure no break in the cortex. We then placed 5-0 x 35 mm cortical pedicle screws into the pedicles bilaterally at L4. We then placed lordotic rods into the multiaxial screw heads of the pedicle screws and locked these in position with the locking caps and anti-torque device. We then checked our construct with AP and lateral fluoroscopy. Irrigated with copious amounts of bacitracin-containing saline solution. Placed a medium Hemovac drain through separate stab incision. Inspected the nerve roots once again to assure adequate decompression, lined to the dura with Gelfoam, and closed the muscle and the fascia with 0 Vicryl. Closed the subcutaneous tissues with 2-0 Vicryl and subcuticular tissues with 3-0 Vicryl. The skin was closed with benzoin and Steri-Strips. Dressing was then applied, the patient was awakened from general anesthesia and transported to the recovery room in stable condition. At the end of the procedure all sponge, needle and instrument counts were correct.   PLAN OF CARE: Admit to inpatient   PATIENT DISPOSITION:  PACU - hemodynamically stable.   Delay start of Pharmacological VTE agent (>24hrs) due to surgical blood loss or risk of bleeding:  yes

## 2015-09-30 NOTE — Progress Notes (Signed)
Pharmacy asked to dose post-op vancomycin for prophylaxis.  Patient received vancomycin 1g IV x1 at 1215 today.  Renal function is stable.  Plan: -vancomycin 1250mg  IV x1 at 2230 tonight -Pharmacy to sign off as no further doses required  Michaela Holder, PharmD, BCPS Clinical Pharmacist Pager: [phone] 09/30/2015 8:11 PM

## 2015-09-30 NOTE — Transfer of Care (Signed)
Immediate Anesthesia Transfer of Care Note  Patient: Michaela Holder  Procedure(s) Performed: Procedure(s) with comments: L2-L3 - L3-L4 MAXIMUM ACCESS (MAS) POSTERIOR LUMBAR INTERBODY FUSION (PLIF)  (N/A) - L2-L3 - L3-L4 MAXIMUM ACCESS (MAS) POSTERIOR LUMBAR INTERBODY FUSION (PLIF)   Patient Location: PACU  Anesthesia Type:General  Level of Consciousness: awake, alert  and oriented  Airway & Oxygen Therapy: Patient Spontanous Breathing and Patient connected to nasal cannula oxygen  Post-op Assessment: Report given to RN, Post -op Vital signs reviewed and stable and Patient moving all extremities X 4  Post vital signs: Reviewed and stable  Last Vitals:  Filed Vitals:   09/30/15 1125  BP: 137/77  Pulse: 65  Temp: 36.4 C  Resp: 20    Last Pain:  Filed Vitals:   09/30/15 1625  PainSc: 5       Patients Stated Pain Goal: 3 ([id number])  Complications: No apparent anesthesia complications

## 2015-09-30 NOTE — Anesthesia Procedure Notes (Signed)
Procedure Name: Intubation Date/Time: 09/30/2015 12:22 PM Performed by: Merrilyn Puma B Pre-anesthesia Checklist: Patient identified, Emergency Drugs available, Timeout performed, Suction available and Patient being monitored Patient Re-evaluated:Patient Re-evaluated prior to inductionOxygen Delivery Method: Circle system utilized Preoxygenation: Pre-oxygenation with 100% oxygen Intubation Type: IV induction Ventilation: Mask ventilation without difficulty Laryngoscope Size: Mac and 3 Grade View: Grade I Tube type: Oral Tube size: 7.0 mm Number of attempts: 1 Airway Equipment and Method: Stylet Placement Confirmation: CO2 detector,  positive ETCO2,  ETT inserted through vocal cords under direct vision and breath sounds checked- equal and bilateral Secured at: 21 cm Tube secured with: Tape Dental Injury: Teeth and Oropharynx as per pre-operative assessment

## 2015-09-30 NOTE — Anesthesia Preprocedure Evaluation (Addendum)
Anesthesia Evaluation  Patient identified by MRN, date of birth, ID band Patient awake    Reviewed: Allergy & Precautions, NPO status , Patient's Chart, lab work & pertinent test results  Airway Mallampati: III  TM Distance: >3 FB Neck ROM: Full    Dental no notable dental hx. (+) Teeth Intact   Pulmonary Current Smoker,    Pulmonary exam normal breath sounds clear to auscultation       Cardiovascular hypertension, Pt. 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   Neuro/Psych negative neurological ROS  negative psych ROS   GI/Hepatic Neg liver ROS, GERD  ,  Endo/Other  diabetes, Well Controlled, Type 2, Oral Hypoglycemic AgentsHypothyroidism   Renal/GU negative Renal ROS  negative genitourinary   Musculoskeletal  (+) Arthritis , Osteoarthritis,  Spondylolisthesis L2-3, L3-4   Abdominal   Peds  Hematology Hx/o DVT and PTE in the past - Off all anticoagulants S/P IVC filter 2013   Anesthesia Other Findings   Reproductive/Obstetrics                           Anesthesia Physical Anesthesia Plan  ASA: III  Anesthesia Plan: General   Post-op Pain Management:    Induction: Intravenous  Airway Management Planned: Oral ETT  Additional Equipment:   Intra-op Plan:   Post-operative Plan: Extubation in OR  Informed Consent: I have reviewed the patients History and Physical, chart, labs and discussed the procedure including the risks, benefits and alternatives for the proposed anesthesia with the patient or authorized representative who has indicated his/her understanding and acceptance.   Dental advisory given  Plan Discussed with: CRNA, Anesthesiologist and Surgeon  Anesthesia Plan Comments:         Anesthesia Quick Evaluation

## 2015-09-30 NOTE — H&P (Signed)
Subjective: Patient is a 65 y.o. female admitted for back and leg pain. Onset of symptoms was several months ago, gradually worsening since that time.  The pain is rated severe, and is located at the across the lower back and radiates to legs. The pain is described as aching and occurs all day. The symptoms have been progressive. Symptoms are exacerbated by exercise. MRI or CT showed spondylolisthesis, stenosis   Past Medical History  Diagnosis Date  . Hypertension   . GERD (gastroesophageal reflux disease)   . Hx of blood clots   . Peripheral vascular disease ([place])   . Diabetes mellitus without complication ([place])   . Arthritis   . Hypothyroidism     ???     Past Surgical History  Procedure Laterality Date  . Abdominal hysterectomy    . Varicose vein surgery    . Ivc  filter    . Back surgery      lumbar fusion 2007  2013    Prior to Admission medications   Medication Sig Start Date End Date Taking? Authorizing Provider  fexofenadine (ALLEGRA) 180 MG tablet Take 180 mg by mouth daily.   Yes Historical Provider, MD  fluticasone (FLONASE) 50 MCG/ACT nasal spray Place 2 sprays into the nose daily.   Yes Historical Provider, MD  gabapentin (NEURONTIN) 300 MG capsule Take 300 mg by mouth daily. 07/06/15  Yes Historical Provider, MD  HYDROcodone-acetaminophen (NORCO/VICODIN) 5-325 MG per tablet Take 1 tablet by mouth every 6 (six) hours as needed for moderate pain.    Yes Historical Provider, MD  lisinopril (PRINIVIL,ZESTRIL) 5 MG tablet Take 5 mg by mouth daily.   Yes Historical Provider, MD  metFORMIN (GLUMETZA) 500 MG (MOD) 24 hr tablet Take 500 mg by mouth 2 (two) times daily with a meal.   Yes Historical Provider, MD  ranitidine (ZANTAC) 150 MG tablet Take 150 mg by mouth daily as needed for heartburn.    Yes Historical Provider, MD   Allergies  Allergen Reactions  . Neomycin-Bacitracin Zn-Polymyx Hives and Itching  . Bacitracin-Polymyxin B   . Keflex [Cephalexin] Hives     Social History  Substance Use Topics  . Smoking status: Current Every Day Smoker -- 0.50 packs/day for 20 years    Types: Cigarettes  . Smokeless tobacco: Never Used  . Alcohol Use: No    History reviewed. No pertinent family history.   Review of Systems  Positive ROS: neg  All other systems have been reviewed and were otherwise negative with the exception of those mentioned in the HPI and as above.  Objective: Vital signs in last 24 hours: Temp:  [97.6 F (36.4 C)] 97.6 F (36.4 C) (05/17 1125) Pulse Rate:  [65] 65 (05/17 1125) Resp:  [20] 20 (05/17 1125) BP: (137)/(77) 137/77 mmHg (05/17 1125) SpO2:  [100 %] 100 % (05/17 1125) Weight:  [81.647 kg (180 lb)-81.92 kg (180 lb 9.6 oz)] 81.647 kg (180 lb) (05/17 1125)  General Appearance: Alert, cooperative, no distress, appears stated age Head: Normocephalic, without obvious abnormality, atraumatic Eyes: PERRL, conjunctiva/corneas clear, EOM's intact    Neck: Supple, symmetrical, trachea midline Back: Symmetric, no curvature, ROM normal, no CVA tenderness Lungs:  respirations unlabored Heart: Regular rate and rhythm Abdomen: Soft, non-tender Extremities: Extremities normal, atraumatic, no cyanosis or edema Pulses: 2+ and symmetric all extremities Skin: Skin color, texture, turgor normal, no rashes or lesions  NEUROLOGIC:   Mental status: Alert and oriented x4,  no aphasia, good attention span, fund of knowledge,  and memory Motor Exam - grossly normal Sensory Exam - grossly normal Reflexes: 1+ Coordination - grossly normal Gait - grossly normal Balance - grossly normal Cranial Nerves: I: smell Not tested  II: visual acuity  OS: nl    OD: nl  II: visual fields Full to confrontation  II: pupils Equal, round, reactive to light  III,VII: ptosis None  III,IV,VI: extraocular muscles  Full ROM  V: mastication Normal  V: facial light touch sensation  Normal  V,VII: corneal reflex  Present  VII: facial muscle function -  upper  Normal  VII: facial muscle function - lower Normal  VIII: hearing Not tested  IX: soft palate elevation  Normal  IX,X: gag reflex Present  XI: trapezius strength  5/5  XI: sternocleidomastoid strength 5/5  XI: neck flexion strength  5/5  XII: tongue strength  Normal    Data Review Lab Results  Component Value Date   WBC 7.0 09/22/2015   HGB 14.9 09/22/2015   HCT 45.1 09/22/2015   MCV 93.0 09/22/2015   PLT 274 09/22/2015   Lab Results  Component Value Date   NA 141 09/22/2015   K 4.3 09/22/2015   CL 102 09/22/2015   CO2 28 09/22/2015   BUN 12 09/22/2015   CREATININE 0.63 09/22/2015   GLUCOSE 86 09/22/2015   Lab Results  Component Value Date   INR 0.97 09/22/2015    Assessment/Plan: Patient admitted for PLIF L2-3, L3-4. Patient has failed a reasonable attempt at conservative therapy.  I explained the condition and procedure to the patient and answered any questions.  Patient wishes to proceed with procedure as planned. Understands risks/ benefits and typical outcomes of procedure.   Robie Mcniel S 09/30/2015 11:51 AM

## 2015-09-30 NOTE — Anesthesia Postprocedure Evaluation (Signed)
Anesthesia Post Note  Patient: Michaela Holder  Procedure(s) Performed: Procedure(s) (LRB): L2-L3 - L3-L4 MAXIMUM ACCESS (MAS) POSTERIOR LUMBAR INTERBODY FUSION (PLIF)  (N/A)  Patient location during evaluation: PACU Anesthesia Type: General Level of consciousness: awake and alert and patient cooperative Pain management: pain level controlled Vital Signs Assessment: post-procedure vital signs reviewed and stable Respiratory status: spontaneous breathing and respiratory function stable Cardiovascular status: stable Anesthetic complications: no    Last Vitals:  Filed Vitals:   09/30/15 1635 09/30/15 1650  BP: 123/67 117/61  Pulse: 74 76  Temp:    Resp: 12 13    Last Pain:  Filed Vitals:   09/30/15 1656  PainSc: [place]

## 2015-10-01 ENCOUNTER — Encounter (HOSPITAL_COMMUNITY): Payer: Self-pay | Admitting: Neurological Surgery

## 2015-10-01 LAB — GLUCOSE, CAPILLARY
GLUCOSE-CAPILLARY: 191 mg/dL — AB (ref 65–99)
Glucose-Capillary: 132 mg/dL — ABNORMAL HIGH (ref 65–99)
Glucose-Capillary: 126 mg/dL — ABNORMAL HIGH (ref 65–99)
Glucose-Capillary: 133 mg/dL — ABNORMAL HIGH (ref 65–99)

## 2015-10-01 MED ORDER — DIAZEPAM 5 MG PO TABS
5.0000 mg | ORAL_TABLET | Freq: Every evening | ORAL | Status: DC | PRN
  Administered 2015-10-01: 5 mg via ORAL
  Filled 2015-10-01: qty 1

## 2015-10-01 MED FILL — Sodium Chloride IV Soln 0.9%: INTRAVENOUS | Qty: 1000 | Status: AC

## 2015-10-01 MED FILL — Heparin Sodium (Porcine) Inj 1000 Unit/ML: INTRAMUSCULAR | Qty: 30 | Status: AC

## 2015-10-01 NOTE — Evaluation (Signed)
Physical Therapy Evaluation Patient Details Name: Michaela Holder MRN: [id number] DOB: 1951-02-05 Today's Date: 10/01/2015   History of Present Illness  Pt is a 65 y.o. female s/p L2-4 MAS PLIF. PMHx: HTN, GERD, Peripheral vascular disease, DM, Arithritis, Back Surgeries x2, and hx of Blood Clots now s/p IVC Filter.  Clinical Impression  Pt moving well and has good recall of back precautions and log roll technique from previous back surgeries.  Pt indicates she has all needed DME and support from her spouse.  Pt ready for D/C from PT perspective, but if pt remains on acute will check back tomorrow to ensure no question or concerns.      Follow Up Recommendations No PT follow up;Supervision - Intermittent    Equipment Recommendations  None recommended by PT    Recommendations for Other Services       Precautions / Restrictions Precautions Precautions: Back;Fall Precaution Booklet Issued: Yes (comment) Precaution Comments: Educated pt and family on back precautions. Required Braces or Orthoses: Spinal Brace Spinal Brace: Lumbar corset;Applied in sitting position Restrictions Weight Bearing Restrictions: No      Mobility  Bed Mobility Overal bed mobility: Needs Assistance Bed Mobility: Sit to Sidelying         Sit to sidelying: Min assist General bed mobility comments: A with returning LEs to bed.  pt demonstrates good log roll.  Transfers Overall transfer level: Needs assistance Equipment used: Rolling walker (2 wheeled) Transfers: Sit to/from Stand Sit to Stand: Supervision         General transfer comment: Supervision for safety; no physical assist required. VCs for hand placement. Pt with good technique.  Ambulation/Gait Ambulation/Gait assistance: Supervision Ambulation Distance (Feet): 180 Feet Assistive device: Rolling walker (2 wheeled) Gait Pattern/deviations: Step-through pattern;Decreased stride length     General Gait Details: pt moves slowly, but  demonstrates good use of RW.    Stairs            Wheelchair Mobility    Modified Rankin (Stroke Patients Only)       Balance Overall balance assessment: Needs assistance Sitting-balance support: No upper extremity supported;Feet supported Sitting balance-Leahy Scale: Good     Standing balance support: No upper extremity supported;During functional activity Standing balance-Leahy Scale: Fair Standing balance comment: pt able to stand at sink for hand hygiene without A.                               Pertinent Vitals/Pain Pain Assessment: 0-10 Pain Score: 5  Faces Pain Scale: Hurts even more Pain Location: BAck Pain Descriptors / Indicators: Aching;Discomfort;Grimacing Pain Intervention(s): Monitored during session;Repositioned;Patient requesting pain meds-RN notified    Home Living Family/patient expects to be discharged to:: Private residence Living Arrangements: Spouse/significant other Available Help at Discharge: Family;Available 24 hours/day Type of Home: House Home Access: Stairs to enter Entrance Stairs-Rails: None Entrance Stairs-Number of Steps: 1 Home Layout: One level Home Equipment: Walker - 2 wheels;Cane - single point;Bedside commode;Grab bars - tub/shower      Prior Function Level of Independence: Independent               Hand Dominance        Extremity/Trunk Assessment   Upper Extremity Assessment: Defer to OT evaluation           Lower Extremity Assessment: Overall WFL for tasks assessed      Cervical / Trunk Assessment: Other exceptions  Communication   Communication: No  difficulties  Cognition Arousal/Alertness: Awake/alert Behavior During Therapy: WFL for tasks assessed/performed Overall Cognitive Status: Within Functional Limits for tasks assessed                      General Comments      Exercises        Assessment/Plan    PT Assessment Patient needs continued PT services  PT  Diagnosis Difficulty walking;Acute pain   PT Problem List Decreased activity tolerance;Decreased balance;Decreased mobility;Decreased knowledge of use of DME;Decreased knowledge of precautions;Pain  PT Treatment Interventions DME instruction;Gait training;Stair training;Functional mobility training;Therapeutic activities;Therapeutic exercise;Balance training;Neuromuscular re-education;Patient/family education   PT Goals (Current goals can be found in the Care Plan section) Acute Rehab PT Goals Patient Stated Goal: decrease pain PT Goal Formulation: With patient Time For Goal Achievement: 10/08/15 Potential to Achieve Goals: Good    Frequency Min 5X/week   Barriers to discharge        Co-evaluation               End of Session Equipment Utilized During Treatment: Gait belt;Back brace Activity Tolerance: Patient tolerated treatment well Patient left: in bed;with call bell/phone within reach;with nursing/sitter in room Nurse Communication: Mobility status         Time: [id number] PT Time Calculation (min) (ACUTE ONLY): 18 min   Charges:   PT Evaluation $PT Eval Moderate Complexity: 1 Procedure     PT G CodesCatarina Hartshorn, [place] 10/01/2015, 11:35 AM

## 2015-10-01 NOTE — Evaluation (Signed)
Occupational Therapy Evaluation Patient Details Name: Michaela Holder MRN: [id number] DOB: [birth year] Today's Date: 10/01/2015    History of Present Illness Pt is a 65 y.o. female s/p L2-4 MAS PLIF. PMHx: HTN, GERD, Peripheral vascular disease, DM, Arithritis.    Clinical Impression   Pt reports she was independent with ADLs and mobility PTA. Currently pt overall supervision for safety with ADLs and functional mobility with the exception of min-mod assist for LB ADLs. Began back, safety, and ADL education with pt and family. Pt planning to d/c home with 24/7 supervision from her husband. Pt would benefit from continued skilled OT to address established goals.     Follow Up Recommendations  No OT follow up;Supervision - Intermittent    Equipment Recommendations  None recommended by OT    Recommendations for Other Services PT consult     Precautions / Restrictions Precautions Precautions: Back;Fall Precaution Booklet Issued: Yes (comment) Precaution Comments: Educated pt and family on back precautions. Required Braces or Orthoses: Spinal Brace Spinal Brace: Lumbar corset;Applied in sitting position Restrictions Weight Bearing Restrictions: No      Mobility Bed Mobility               General bed mobility comments: Pt OOB in chair upon arrival.  Transfers Overall transfer level: Needs assistance Equipment used: Rolling walker (2 wheeled) Transfers: Sit to/from Stand Sit to Stand: Supervision         General transfer comment: Supervision for safety; no physical assist required. VCs for hand placement. Pt with good technique.    Balance Overall balance assessment: Needs assistance Sitting-balance support: No upper extremity supported;Feet supported Sitting balance-Leahy Scale: Good     Standing balance support: Bilateral upper extremity supported Standing balance-Leahy Scale: Poor Standing balance comment: RW for support                             ADL Overall ADL's : Needs assistance/impaired Eating/Feeding: Independent   Grooming: Supervision/safety;Standing Grooming Details (indicate cue type and reason): Educated pt on use of 2 cups for oral care Upper Body Bathing: Supervision/ safety;Sitting   Lower Body Bathing: Minimal assistance;Sit to/from stand   Upper Body Dressing : Supervision/safety;Standing Upper Body Dressing Details (indicate cue type and reason): to don hospital gown. Pt with no questions regarding donning/doffing back brace Lower Body Dressing: Moderate assistance;Sit to/from stand Lower Body Dressing Details (indicate cue type and reason): Pt unable to cross foot over opposite knee. Pt reports husband can assist as needed. Toilet Transfer: Supervision/safety;Ambulation;BSC;RW (BSC over toilet) Toilet Transfer Details (indicate cue type and reason): Simulated by sit to stand from chair Toileting- Clothing Manipulation and Hygiene: Supervision/safety;Sit to/from stand       Functional mobility during ADLs: Supervision/safety;Rolling walker General ADL Comments: Educated pt and family on maintaining back precautions during functional activities, home safety. Pt reports she remembers a lot from her previous 2 back sx.      Vision     Perception     Praxis      Pertinent Vitals/Pain Pain Assessment: Faces Faces Pain Scale: Hurts even more Pain Location: back Pain Descriptors / Indicators: Grimacing;Aching Pain Intervention(s): Limited activity within patient's tolerance;Monitored during session;Patient requesting pain meds-RN notified     Hand Dominance     Extremity/Trunk Assessment Upper Extremity Assessment Upper Extremity Assessment: Overall WFL for tasks assessed   Lower Extremity Assessment Lower Extremity Assessment: Defer to PT evaluation   Cervical / Trunk Assessment Cervical /  Trunk Assessment: Other exceptions Cervical / Trunk Exceptions: s/p lumbar sx   Communication  Communication Communication: No difficulties   Cognition Arousal/Alertness: Awake/alert Behavior During Therapy: WFL for tasks assessed/performed Overall Cognitive Status: Within Functional Limits for tasks assessed                     General Comments       Exercises       Shoulder Instructions      Home Living Family/patient expects to be discharged to:: Private residence Living Arrangements: Spouse/significant other Available Help at Discharge: Family;Available 24 hours/day Type of Home: House Home Access: Stairs to enter [employer] of Steps: 1   Home Layout: One level     Bathroom Shower/Tub: Walk-in shower;Tub/shower unit (Uses walk in)   Bathroom Toilet: Handicapped height Bathroom Accessibility: Yes How Accessible: Accessible via walker Home Equipment: [place] - 2 wheels;Cane - single point;Bedside commode;Grab bars - tub/shower          Prior Functioning/Environment Level of Independence: Independent             OT Diagnosis: Acute pain   OT Problem List: Decreased strength;Decreased activity tolerance;Impaired balance (sitting and/or standing);Decreased knowledge of use of DME or AE;Decreased knowledge of precautions;Pain   OT Treatment/Interventions: Self-care/ADL training;Energy conservation;DME and/or AE instruction;Therapeutic activities;Patient/family education;Balance training    OT Goals(Current goals can be found in the care plan section) Acute Rehab OT Goals Patient Stated Goal: decrease pain OT Goal Formulation: With patient Time For Goal Achievement: 10/15/15 Potential to Achieve Goals: Good ADL Goals Pt Will Transfer to Toilet: with modified independence;ambulating;bedside commode Pt Will Perform Toileting - Clothing Manipulation and hygiene: with modified independence;sit to/from stand Pt Will Perform Tub/Shower Transfer: Shower transfer;with modified independence;ambulating;3 in 1;rolling walker Additional ADL Goal  #1: Pt will independently don/doff back brace as a precursor for ADLs and functional mobility. Additional ADL Goal #2: Pt will independently verbally recall 3/3 back precautions and maintain throughout ADL.  OT Frequency: Min 2X/week   Barriers to D/C:            Co-evaluation              End of Session Equipment Utilized During Treatment: Gait belt;Rolling walker;Back brace Nurse Communication: Patient requests pain meds;Mobility status  Activity Tolerance: Patient tolerated treatment well Patient left: in chair;with call bell/phone within reach;with family/visitor present   Time: [id number] OT Time Calculation (min): 17 min Charges:  OT General Charges $OT Visit: 1 Procedure OT Evaluation $OT Eval Moderate Complexity: 1 Procedure G-Codes:     Binnie Kand M.S., OTR/L Pager: [phone]  10/01/2015, 10:21 AM

## 2015-10-01 NOTE — Progress Notes (Signed)
Patient ID: Michaela Holder, female   DOB: 03-21-1951, 65 y.o.   MRN: [id number] Subjective: Patient reports appropriate back soreness. No leg pain or numbness tingling or weakness.  Objective: Vital signs in last 24 hours: Temp:  [97.2 F (36.2 C)-99 F (37.2 C)] 98.8 F (37.1 C) (05/18 0422) Pulse Rate:  [60-84] 68 (05/18 0422) Resp:  [10-20] 16 (05/18 0422) BP: (100-142)/(46-82) 100/46 mmHg (05/18 0422) SpO2:  [93 %-100 %] 97 % (05/18 0422) FiO2 (%):  [2 %] 2 % (05/18 0422) Weight:  [81.647 kg (180 lb)-81.92 kg (180 lb 9.6 oz)] 81.647 kg (180 lb) (05/17 1125)  Intake/Output from previous day: 05/17 0701 - 05/18 0700 In: 1800 [P.O.:200; I.V.:1600] Out: 3250 [Urine:3050; Blood:200] Intake/Output this shift:    Neurologic: Grossly normal  Lab Results: Lab Results  Component Value Date   WBC 7.0 09/22/2015   HGB 14.9 09/22/2015   HCT 45.1 09/22/2015   MCV 93.0 09/22/2015   PLT 274 09/22/2015   Lab Results  Component Value Date   INR 0.97 09/22/2015   BMET Lab Results  Component Value Date   NA 141 09/22/2015   K 4.3 09/22/2015   CL 102 09/22/2015   CO2 28 09/22/2015   GLUCOSE 86 09/22/2015   BUN 12 09/22/2015   CREATININE 0.63 09/22/2015   CALCIUM 9.7 09/22/2015    Studies/Results: Dg Lumbar Spine 2-3 Views  09/30/2015  CLINICAL DATA:  Spinal fusion. EXAM: LUMBAR SPINE - 2-3 VIEW; DG C-ARM 61-120 MIN COMPARISON:  01/15/2012. FINDINGS: Lumbosacral posterior and interbody fusion noted. Hardware intact. Vertebral bodies difficult to number on the available image. One image obtained. 1 minutes 43 seconds fluoroscopy time. IMPRESSION: Postsurgical changes lumbosacral spine. Electronically Signed   By: Marcello Moores  Register   On: 09/30/2015 16:36   Dg C-arm 1-60 Min  09/30/2015  CLINICAL DATA:  Spinal fusion. EXAM: LUMBAR SPINE - 2-3 VIEW; DG C-ARM 61-120 MIN COMPARISON:  01/15/2012. FINDINGS: Lumbosacral posterior and interbody fusion noted. Hardware intact. Vertebral  bodies difficult to number on the available image. One image obtained. 1 minutes 43 seconds fluoroscopy time. IMPRESSION: Postsurgical changes lumbosacral spine. Electronically Signed   By: Marcello Moores  Register   On: 09/30/2015 16:36    Assessment/Plan: Mobilize today with therapy. Continue pain management.   LOS: 1 day    Brigitt Mcclish S 10/01/2015, 7:26 AM

## 2015-10-02 LAB — GLUCOSE, CAPILLARY: GLUCOSE-CAPILLARY: 138 mg/dL — AB (ref 65–99)

## 2015-10-02 MED ORDER — OXYCODONE-ACETAMINOPHEN 5-325 MG PO TABS: 1.0000 | ORAL_TABLET | Freq: Four times a day (QID) | ORAL | Status: AC | PRN

## 2015-10-02 MED ORDER — CARISOPRODOL 350 MG PO TABS: 350.0000 mg | ORAL_TABLET | Freq: Three times a day (TID) | ORAL | Status: AC | PRN

## 2015-10-02 NOTE — Progress Notes (Signed)
OT Cancellation Note  Patient Details Name: Michaela Holder MRN: [id number] DOB: Sep 03, 1950   Cancelled Treatment:    Reason Eval/Treat Not Completed: Patient declined, no reason specified. Pt reports that she feels confident in completing all ADLs and transfers and will have plenty of assistance from her family. Pt declined need for further OT services at this time. OT will sign off.  Redmond Baseman, OTR/L Pager: [phone] 10/02/2015, 11:50 AM

## 2015-10-02 NOTE — Progress Notes (Signed)
PT Cancellation Note  Patient Details Name: Michaela Holder MRN: [id number] DOB: [birth year]   Cancelled Treatment:    Reason Eval/Treat Not Completed: Other (comment)  Spoke with pt this am and pt indicates no further PT needs at this time.  This is pt's 3rd back surgery and she did very well with therapy yesterday.  Will f/u if needs arise.     Lecretia Buczek, Thornton Papas 10/02/2015, 9:16 AM

## 2015-10-02 NOTE — Discharge Summary (Signed)
Physician Discharge Summary  Patient ID: Michaela Holder MRN: [id number] DOB/AGE: [birth year] 67 y.o.  Admit date: 09/30/2015 Discharge date: 10/02/2015  Admission Diagnoses: spondylolisthesis/ stenosis    Discharge Diagnoses: same   Discharged Condition: good  Hospital Course: The patient was admitted on 09/30/2015 and taken to the operating room where the patient underwent PLIF. The patient tolerated the procedure well and was taken to the recovery room and then to the floor in stable condition. The hospital course was routine. There were no complications. The wound remained clean dry and intact. Pt had appropriate back soreness. No complaints of leg pain or new N/T/W. The patient remained afebrile with stable vital signs, and tolerated a regular diet. The patient continued to increase activities, and pain was well controlled with oral pain medications.   Consults: None  Significant Diagnostic Studies:  Results for orders placed or performed during the hospital encounter of 09/30/15  Glucose, capillary  Result Value Ref Range   Glucose-Capillary 123 (H) 65 - 99 mg/dL  Glucose, capillary  Result Value Ref Range   Glucose-Capillary 123 (H) 65 - 99 mg/dL  Glucose, capillary  Result Value Ref Range   Glucose-Capillary 158 (H) 65 - 99 mg/dL  Glucose, capillary  Result Value Ref Range   Glucose-Capillary 132 (H) 65 - 99 mg/dL  Glucose, capillary  Result Value Ref Range   Glucose-Capillary 191 (H) 65 - 99 mg/dL   Comment 1 Notify RN    Comment 2 Document in Chart   Glucose, capillary  Result Value Ref Range   Glucose-Capillary 126 (H) 65 - 99 mg/dL   Comment 1 Notify RN    Comment 2 Document in Chart   Glucose, capillary  Result Value Ref Range   Glucose-Capillary 133 (H) 65 - 99 mg/dL  Glucose, capillary  Result Value Ref Range   Glucose-Capillary 138 (H) 65 - 99 mg/dL    Chest 2 View  09/22/2015  CLINICAL DATA:  Preop lumbar surgery.  Smoker, diabetes. EXAM: CHEST  2  VIEW COMPARISON:  12/09/2011 FINDINGS: Heart is normal size. Left lung is clear. Increasing nodular density peripherally along the right hemidiaphragm. No effusions. No acute bony abnormality. IMPRESSION: Increasing nodular density along the right hemidiaphragm. While this could reflect scarring or early infiltrate, I cannot exclude pulmonary nodule. Recommend further evaluation with chest CT. These results will be called to the ordering clinician or representative by the Radiologist Assistant, and communication documented in the PACS or zVision Dashboard. Electronically Signed   By: Rolm Baptise M.D.   On: 09/22/2015 11:47   Dg Lumbar Spine 2-3 Views  09/30/2015  CLINICAL DATA:  Spinal fusion. EXAM: LUMBAR SPINE - 2-3 VIEW; DG C-ARM 61-120 MIN COMPARISON:  01/15/2012. FINDINGS: Lumbosacral posterior and interbody fusion noted. Hardware intact. Vertebral bodies difficult to number on the available image. One image obtained. 1 minutes 43 seconds fluoroscopy time. IMPRESSION: Postsurgical changes lumbosacral spine. Electronically Signed   By: Marcello Moores  Register   On: 09/30/2015 16:36   Dg C-arm 1-60 Min  09/30/2015  CLINICAL DATA:  Spinal fusion. EXAM: LUMBAR SPINE - 2-3 VIEW; DG C-ARM 61-120 MIN COMPARISON:  01/15/2012. FINDINGS: Lumbosacral posterior and interbody fusion noted. Hardware intact. Vertebral bodies difficult to number on the available image. One image obtained. 1 minutes 43 seconds fluoroscopy time. IMPRESSION: Postsurgical changes lumbosacral spine. Electronically Signed   By: Marcello Moores  Register   On: 09/30/2015 16:36    Antibiotics:  Anti-infectives    Start     Dose/Rate Route Frequency  Ordered Stop   09/30/15 2230  vancomycin (VANCOCIN) 1,250 mg in sodium chloride 0.9 % 250 mL IVPB     1,250 mg 166.7 mL/hr over 90 Minutes Intravenous  Once 09/30/15 2009 09/30/15 2342   09/30/15 1537  vancomycin (VANCOCIN) powder  Status:  Discontinued       As needed 09/30/15 1607 09/30/15 1612    09/30/15 1535  vancomycin (VANCOCIN) 1000 MG powder    Comments:  Atilano Median   : cabinet override      09/30/15 1535 10/01/15 0344   09/30/15 0600  vancomycin (VANCOCIN) IVPB 1000 mg/200 mL premix     1,000 mg 200 mL/hr over 60 Minutes Intravenous On call to O.R. 09/29/15 1422 09/30/15 1315      Discharge Exam: Blood pressure 106/51, pulse 64, temperature 98.3 F (36.8 C), temperature source Oral, resp. rate 18, height 5' 5.5" (1.664 m), weight 81.647 kg (180 lb), SpO2 95 %. Neurologic: Grossly normal Dressing dry  Discharge Medications:     Medication List    STOP taking these medications        HYDROcodone-acetaminophen 5-325 MG tablet  Commonly known as:  NORCO/VICODIN      TAKE these medications        carisoprodol 350 MG tablet  Commonly known as:  SOMA  Take 1 tablet (350 mg total) by mouth 3 (three) times daily as needed.     fexofenadine 180 MG tablet  Commonly known as:  ALLEGRA  Take 180 mg by mouth daily.     fluticasone 50 MCG/ACT nasal spray  Commonly known as:  FLONASE  Place 2 sprays into the nose daily.     gabapentin 300 MG capsule  Commonly known as:  NEURONTIN  Take 300 mg by mouth daily.     lisinopril 5 MG tablet  Commonly known as:  PRINIVIL,ZESTRIL  Take 5 mg by mouth daily.     metFORMIN 500 MG (MOD) 24 hr tablet  Commonly known as:  GLUMETZA  Take 500 mg by mouth 2 (two) times daily with a meal.     oxyCODONE-acetaminophen 5-325 MG tablet  Commonly known as:  PERCOCET/ROXICET  Take 1-2 tablets by mouth every 6 (six) hours as needed for moderate pain.     ranitidine 150 MG tablet  Commonly known as:  ZANTAC  Take 150 mg by mouth daily as needed for heartburn.        Disposition: home   Final Dx: PLIF L2-3, L3-4      Discharge Instructions     Remove dressing in 72 hours    Complete by:  As directed      Call MD for:  difficulty breathing, headache or visual disturbances    Complete by:  As directed      Call MD  for:  persistant nausea and vomiting    Complete by:  As directed      Call MD for:  redness, tenderness, or signs of infection (pain, swelling, redness, odor or green/yellow discharge around incision site)    Complete by:  As directed      Call MD for:  severe uncontrolled pain    Complete by:  As directed      Call MD for:  temperature >100.4    Complete by:  As directed      Diet - low sodium heart healthy    Complete by:  As directed      Discharge instructions    Complete by:  As directed  No bending twisting or lifting, may shower     Increase activity slowly    Complete by:  As directed               Signed: Dinorah Masullo S 10/02/2015, 7:35 AM

## 2015-10-02 NOTE — Progress Notes (Signed)
Spoke with Junie Panning at Dr Ronnald Ramp' office, noted new order for CT of chest, pt already had order to d/c home and this RN questioned if this was to be done prior to discharge (noted order placed by [facility] imagining per Dr Ronnald Ramp) . Dr Ronnald Ramp gave order to cancel CT scan and will discuss with pt in office and schedule CT as an outpatient.

## 2015-10-03 NOTE — Care Management Important Message (Signed)
Important Message  Patient Details  Name: KESLEE ECONOMOU MRN: [id number] Date of Birth: Jun 25, 1950   Medicare Important Message Given:  Yes    Ninfa Meeker, RN 10/03/2015, 9:21 AM

## 2015-10-09 ENCOUNTER — Other Ambulatory Visit: Payer: Self-pay | Admitting: Neurological Surgery

## 2015-10-09 DIAGNOSIS — R9389 Abnormal findings on diagnostic imaging of other specified body structures: Secondary | ICD-10-CM

## 2015-10-26 ENCOUNTER — Ambulatory Visit
Admission: RE | Admit: 2015-10-26 | Discharge: 2015-10-26 | Disposition: A | Discharge status: Home / Self Care | Payer: Medicare HMO | Source: Ambulatory Visit | Attending: Neurological Surgery | Admitting: Neurological Surgery

## 2015-10-26 DIAGNOSIS — R9389 Abnormal findings on diagnostic imaging of other specified body structures: Secondary | ICD-10-CM

## 2015-10-26 MED ORDER — IOPAMIDOL (ISOVUE-300) INJECTION 61%
75.0000 mL | Freq: Once | INTRAVENOUS | Status: AC | PRN
  Administered 2015-10-26: 75 mL via INTRAVENOUS

## 2015-12-09 ENCOUNTER — Ambulatory Visit (INDEPENDENT_AMBULATORY_CARE_PROVIDER_SITE_OTHER): Payer: Medicare HMO | Admitting: Podiatry

## 2015-12-09 ENCOUNTER — Encounter: Payer: Self-pay | Admitting: Podiatry

## 2015-12-09 ENCOUNTER — Ambulatory Visit (HOSPITAL_BASED_OUTPATIENT_CLINIC_OR_DEPARTMENT_OTHER)
Admission: RE | Admit: 2015-12-09 | Discharge: 2015-12-09 | Disposition: A | Discharge status: Home / Self Care | Payer: Medicare HMO | Source: Ambulatory Visit | Attending: Podiatry | Admitting: Podiatry

## 2015-12-09 VITALS — BP 153/66 | HR 67 | Resp 18

## 2015-12-09 DIAGNOSIS — M205X2 Other deformities of toe(s) (acquired), left foot: Secondary | ICD-10-CM

## 2015-12-09 DIAGNOSIS — D3610 Benign neoplasm of peripheral nerves and autonomic nervous system, unspecified: Secondary | ICD-10-CM

## 2015-12-09 DIAGNOSIS — R52 Pain, unspecified: Secondary | ICD-10-CM | POA: Diagnosis not present

## 2015-12-09 DIAGNOSIS — M7732 Calcaneal spur, left foot: Secondary | ICD-10-CM | POA: Insufficient documentation

## 2015-12-09 DIAGNOSIS — G5762 Lesion of plantar nerve, left lower limb: Secondary | ICD-10-CM | POA: Diagnosis not present

## 2015-12-09 DIAGNOSIS — M779 Enthesopathy, unspecified: Secondary | ICD-10-CM | POA: Diagnosis not present

## 2015-12-09 DIAGNOSIS — M19072 Primary osteoarthritis, left ankle and foot: Secondary | ICD-10-CM | POA: Insufficient documentation

## 2015-12-09 NOTE — Progress Notes (Signed)
   Subjective:    Patient ID: Michaela Holder, female    DOB: [birth year], 65 y.o.   MRN: [id number]  HPI  65 year old female presents the office today for concerns of pain to her left foot which is been ongoing for several months. She presented to see orthopedics and she steroid injection performed which did help some with painted reoccur. She denies any redness or swelling denies any recent injury or trauma. She recently did have back surgery in May of this year. Pain does not wake her up at night. The majority pain is with walking and standing for prolonged period of time. No other complaints at this time.  Review of Systems  All other systems reviewed and are negative.      Objective:   Physical Exam General: AAO x3, NAD  Dermatological: Skin is warm, dry and supple bilateral. NThere are no open sores, no preulcerative lesions, no rash or signs of infection present.  Vascular: Dorsalis Pedis artery and Posterior Tibial artery pedal pulses are 2/4 bilateral with immedate capillary fill time. Pedal hair growth present.  There is no pain with calf compression, swelling, warmth, erythema.   Neruologic: Grossly intact via light touch bilateral. Vibratory intact via tuning fork bilateral. Protective threshold with Semmes Wienstein monofilament intact to all pedal sites bilateral.   Musculoskeletal: There is decreased range of motion of the first MTPJ with crepitation present. There is pain with first MPJ range of motion. She also has pain in the second interspace left foot. There is mild clicking sensation and concern for neuroma. Also mild tenderness on the insertion of the posterior tibial tendon on to the navicular. Tendon appears intact. There is no pain along the course the posterior tibial tendon. Decrease in medial arch upon weightbearing. Equinus is present. No other areas of pinpoint bony tenderness or pain vibratory sensation this time. Equinus is present.  Gait: Unassisted,  Nonantalgic.     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 -This and she wishes to proceed with steroid injections today. Under sterile conditions a mixture Kenalog and local anesthetic was infiltrated on the first MTPJ as well as the second interspace. Small amount of [place] present phosphate and local anesthetic was infiltrated along the navicular tuberosity. -She was placed into a cam boot today. -Ice -Discussed stretching exercises to start in about 10 days -Discussed shoe gear modifications and orthotics long-term. -Follow-up as correct or sooner if needed.  Celesta Gentile, DPM

## 2015-12-28 ENCOUNTER — Telehealth: Payer: Self-pay | Admitting: *Deleted

## 2015-12-28 NOTE — Telephone Encounter (Addendum)
Pt states she has stopped wearing the cam walker due to it making knots on her legs, her next appt is 01/06/2016 and she would like to know if Dr. Jacqualyn Posey wanted to see her sooner. Pt states when she quit wearing the boot the knots went down, but she does have a history of blood clots.  I told pt I would prefer Dr. Jacqualyn Posey see her Wednesday at the [facility] center, pt agreed and I transferred to schedulers. Informed Dr. Jacqualyn Posey of pt's hx.

## 2015-12-30 ENCOUNTER — Ambulatory Visit (INDEPENDENT_AMBULATORY_CARE_PROVIDER_SITE_OTHER): Payer: Medicare HMO | Admitting: Podiatry

## 2015-12-30 ENCOUNTER — Encounter: Payer: Self-pay | Admitting: Podiatry

## 2015-12-30 DIAGNOSIS — R52 Pain, unspecified: Secondary | ICD-10-CM

## 2015-12-30 DIAGNOSIS — M779 Enthesopathy, unspecified: Secondary | ICD-10-CM | POA: Diagnosis not present

## 2015-12-30 DIAGNOSIS — M205X2 Other deformities of toe(s) (acquired), left foot: Secondary | ICD-10-CM

## 2015-12-30 DIAGNOSIS — D3610 Benign neoplasm of peripheral nerves and autonomic nervous system, unspecified: Secondary | ICD-10-CM

## 2015-12-30 DIAGNOSIS — G5762 Lesion of plantar nerve, left lower limb: Secondary | ICD-10-CM | POA: Diagnosis not present

## 2015-12-30 MED ORDER — DICLOFENAC SODIUM 1 % TD GEL: 2.0000 g | Freq: Four times a day (QID) | TRANSDERMAL | 2 refills | Status: AC

## 2016-01-01 NOTE — Progress Notes (Signed)
Subjective: 65 year old female presents the office today as her boot was causing discomfort. She states that she had a small nonfarming the front of her leg. She had a history of blood clots as remove the boot. She states that he did not file she did when she had a blood clot. She denies any calf pain, swelling or any warmth. She states her big toe joint still hurts quite a bit as well as in between her second third toe she gets some numbness. She also has been pain still on the medial aspect of the foot however this is somewhat improved. Denies any systemic complaints such as fevers, chills, nausea, vomiting. No acute changes since last appointment, and no other complaints at this time.   Objective: AAO x3, NAD DP/PT pulses palpable bilaterally, CRT less than 3 seconds There is decreased range of motion of the left first MTPJ with crepitation with range of motion. There is pain with range of motion of the first MPJ. There is dorsal spurring off the joint. There is tenderness on the second interspace. There is a palpable neuroma identified at this time. There is a clicking sensation. Mild tenderness along the navicular tuberosity insertion of the posterior tibial tendon. No pain along the course the tendon otherwise. There is no other areas of tenderness bilaterally. There is mild edema on the first MTPJ without any erythema or increase in warmth. No open lesions or pre-ulcerative lesions. On the anterior aspect of the left leg there is a small area which appears to be skin irritation from the boot. There is no pain with calf compression, swelling, warmth, erythema.   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  -At this time I discussed surgical and conservative treatment for her pain. [place] pain appears to be  localized to the first MTPJ. She gets some relief the last steroid injection but the pain to reoccur. She also developed a cam boot was helping quite a bit. At this time should proceed with another steroid injection this area. Under sterile conditions a mixture of Kenalog and local anesthetic was infiltrated into and around the first MTPJ without complications. Post injection care was discussed. Also small amount of steroid was infiltrated the second interspace with local anesthetic without any complications or bleeding onset of possible neuroma.  -I discussed with her orthotics. She did not pursue custom orthotics due to cost. I dispensed an over-the-counter insert I added a graphite insert and shoe as well to help stiffen up. I discussed that if symptoms continue with surgical intervention. Most likely first MTPJ arthroplasty with implant. She would hold off on surgery for now.  -Patient encouraged to call the office with any questions, concerns, change in symptoms.  Celesta Gentile, DPM

## 2016-01-06 ENCOUNTER — Ambulatory Visit: Payer: Medicare HMO | Admitting: Podiatry

## 2016-01-26 ENCOUNTER — Ambulatory Visit (INDEPENDENT_AMBULATORY_CARE_PROVIDER_SITE_OTHER): Payer: Medicare HMO | Admitting: Podiatry

## 2016-01-26 ENCOUNTER — Encounter: Payer: Self-pay | Admitting: Podiatry

## 2016-01-26 DIAGNOSIS — M779 Enthesopathy, unspecified: Secondary | ICD-10-CM

## 2016-01-26 DIAGNOSIS — M205X9 Other deformities of toe(s) (acquired), unspecified foot: Secondary | ICD-10-CM | POA: Insufficient documentation

## 2016-01-26 DIAGNOSIS — M205X2 Other deformities of toe(s) (acquired), left foot: Secondary | ICD-10-CM

## 2016-01-26 NOTE — Progress Notes (Signed)
Subjective: 65 year old female presents the office today for evaluation of left foot hallux rigidus, capsulitis. She states that overall she is doing better and the only pain that she is having some big toe joint. She is currently on oral prednisone due to bronchitis. She feels that the comminution down the steroid injection has helped as well as the offloading pads the orthotic. She was started on any surgical intervention at this time as her husband is undergoing a knee replacement and she has upcoming wedding that sure to tendinitis couple months. Denies any systemic complaints such as fevers, chills, nausea, vomiting. No acute changes since last appointment, and no other complaints at this time.   Objective: AAO x3, NAD DP/PT pulses palpable bilaterally, CRT less than 3 seconds There is decreased range of motion of the first MTPJ and the left side is crepitation with MPJ range of motion. There is trace edema to the dorsal aspect of the joint with any erythema or increase in warmth. There is tenderness the first MTPJ. There is no other areas of tenderness at this time and no pinpoint bony tenderness. There is no palpable neuroma identified. No edema, erythema, increase in warmth to bilateral lower extremities.  No open lesions or pre-ulcerative lesions.  No pain with calf compression, swelling, warmth, erythema  Assessment: 65 year old female left foot capsulitis, hallux rigidus  Plan: -All treatment options discussed with the patient including all alternatives, risks, complications.  -Discussed repeat steroid injection to the area and she wishes to proceed and are seeing risks and complications. Under sterile conditions a mixture of excellent is an phosphate the small amount of Kenalog and local anesthetic was infiltrated into the first MTPJ without complications. Post injection care was discussed. I modified her insert to further offload the area. Discussed shoe gear changes about. In the future  discussed with her surgical intervention. Follow-up as needed.  -Patient encouraged to call the office with any questions, concerns, change in symptoms.   Celesta Gentile, DPM

## 2017-01-03 ENCOUNTER — Encounter: Payer: Self-pay | Admitting: Podiatry

## 2017-01-03 ENCOUNTER — Telehealth: Payer: Self-pay | Admitting: Podiatry

## 2017-01-03 ENCOUNTER — Ambulatory Visit (INDEPENDENT_AMBULATORY_CARE_PROVIDER_SITE_OTHER): Payer: Medicare HMO | Admitting: Podiatry

## 2017-01-03 DIAGNOSIS — M722 Plantar fascial fibromatosis: Secondary | ICD-10-CM | POA: Insufficient documentation

## 2017-01-03 DIAGNOSIS — M7742 Metatarsalgia, left foot: Secondary | ICD-10-CM

## 2017-01-03 DIAGNOSIS — M779 Enthesopathy, unspecified: Secondary | ICD-10-CM | POA: Diagnosis not present

## 2017-01-03 DIAGNOSIS — M205X2 Other deformities of toe(s) (acquired), left foot: Secondary | ICD-10-CM | POA: Diagnosis not present

## 2017-01-03 NOTE — Telephone Encounter (Signed)
I was seen by Dr. Jacqualyn Posey this morning and he was supposed to give me the name of the surgery he suggested so I could look it up and read up on it. I did not get the name and was wanting that information.

## 2017-01-03 NOTE — Progress Notes (Signed)
Subjective: Michaela Holder presents the office today with her husband for concerns of continued pain of her left big toe. She states that after the injection last appointment her symptoms greatly improved as the injection wore off she is started to get quite a bit of pain to the big toe joint which is limiting her activity and she states that she can barely do activities due to the pain. She states that because of this she started to get pain at the arch of her foot as well as the ball of the foot and she pieces May because she is walking differently. She does have some numbness to her big toe and her second toe is been ongoing and not new. Denies any recent injury or trauma since last appointment and denies any swelling or redness. She has no other concerns today. Denies any systemic complaints such as fevers, chills, nausea, vomiting. No acute changes since last appointment, and no other complaints at this time.   Objective: AAO x3, NAD DP/PT pulses palpable bilaterally, CRT less than 3 seconds There is decreased range of motion left first MTPJ and there is tenderness palpation with MPJ range of motion and along the joint upon palpation. There is no overlying edema, erythema, increase in warmth. There doesn't be to be some mild discomfort submetatarsal 2 through 4 prominence the metatarsal heads plantarly with atrophy of the fat pad. There is no overlying edema, erythema, increase in warmth. There is no palpable neuroma identified. Other than the first MPJ there is no other areas of pinpoint tenderness. There is some mild discomfort on medial pain of the plantar fascia within the arch of the foot. There is no pain on the insertion of the calcaneus. Ankle, subtalar joint range of motion intact. No open lesions or pre-ulcerative lesions.  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  -At this point we had along discussion both Levonne Lapping conservative as well as surgical treatment options. Discussed with her surgical options including the first MPJ arthrodesis versus implant. She was leaning more towards an implant but she was indecisive today. She will think about the surgery. In the meantime she looks to proceed with a steroid injection into the first MPJ and the left foot. Understanding risks of the complications she wishes to proceed. Admission of Kenalog and local anesthetic was infiltrated into and around the first MPJ with any complications. Post injection care was discussed.  -Given with surgery I discussed with her she still needs orthotic and supportive shoes to help with the other issues to her feet and this may or may not improve the plantar fasciitis and metatarsalgia that she's having. I do within reason why she is getting these other problems is because the lesion is walking because of the pain to the big toe however.  -On her to think about surgery as well as treatment options. If she decides to proceed with surgery at discussed with her to call [place] and her number was given to her. I will see her back before surgery for consent and final decision the procedure. -Patient encouraged to call the office with any questions, concerns, change in symptoms.   Celesta Gentile, DPM

## 2017-01-03 NOTE — Telephone Encounter (Signed)
Michaela Holder,  Dr Jacqualyn Posey stated that  the type of surgery was on the AVS that he printed out for her and could you call her and let her know. Thanks Lattie Haw

## 2017-01-03 NOTE — Telephone Encounter (Signed)
Called pt and asked if she got her AVS at check out. Pt stated she did not get anything at check out. I asked the pt if she has access to MyChart in which she replied she does but cannot remember her password. She asked me to e-mail her AVS to which I responded I would. I told the pt what to look up in regards to the surgery as well as her diagnosis. Pt thanked me.

## 2017-01-03 NOTE — Patient Instructions (Signed)
The surgery we talked about doing is doing a big toe joint replacement. We also talked about a fusion of the big toe joint.

## 2019-01-17 ENCOUNTER — Other Ambulatory Visit: Payer: Self-pay | Admitting: Neurological Surgery

## 2019-01-17 ENCOUNTER — Telehealth: Payer: Self-pay | Admitting: Family Medicine

## 2019-01-17 ENCOUNTER — Telehealth: Payer: Self-pay | Admitting: Nurse Practitioner

## 2019-01-17 DIAGNOSIS — M961 Postlaminectomy syndrome, not elsewhere classified: Secondary | ICD-10-CM

## 2019-01-17 NOTE — Telephone Encounter (Signed)
Phone call to patient to verify medication list and allergies for myelogram procedure. Pt aware she will not need to hold any medications for this procedure. Pre and post procedure instructions reviewed with pt. 

## 2019-01-24 NOTE — Discharge Instructions (Signed)

## 2019-01-25 ENCOUNTER — Ambulatory Visit
Admission: RE | Admit: 2019-01-25 | Discharge: 2019-01-25 | Disposition: A | Discharge status: Home / Self Care | Payer: Medicare HMO | Source: Ambulatory Visit | Attending: Neurological Surgery | Admitting: Neurological Surgery

## 2019-01-25 ENCOUNTER — Other Ambulatory Visit: Payer: Self-pay

## 2019-01-25 DIAGNOSIS — M961 Postlaminectomy syndrome, not elsewhere classified: Secondary | ICD-10-CM

## 2019-01-25 MED ORDER — IOPAMIDOL (ISOVUE-M 200) INJECTION 41%
15.0000 mL | Freq: Once | INTRAMUSCULAR | Status: AC
  Administered 2019-01-25: 15 mL via INTRATHECAL

## 2019-01-25 MED ORDER — DIAZEPAM 5 MG PO TABS
5.0000 mg | ORAL_TABLET | Freq: Once | ORAL | Status: AC
  Administered 2019-01-25: 10:00:00 5 mg via ORAL

## 2020-10-21 ENCOUNTER — Telehealth: Payer: Self-pay

## 2020-10-21 ENCOUNTER — Other Ambulatory Visit: Payer: Self-pay | Admitting: Neurological Surgery

## 2020-10-21 DIAGNOSIS — M5416 Radiculopathy, lumbar region: Secondary | ICD-10-CM

## 2020-10-21 NOTE — Telephone Encounter (Signed)
Phone call to patient to verify medication list and allergies for myelogram procedure. Medications pt is currently taking are safe to continue to take. Advised pt if any new medications are started prior to procedure to call and make [place] aware. Pt also instructed to have a driver the day of the procedure and discharge instructions. Pt verbalized understanding.  Pt also reports she has a spinal cord stimulator. Advised pt to bring the remote the day of the procedure so we could place it in "procedure" mode while she goes through the CT scan. Pt verbalized understanding.

## 2020-10-26 ENCOUNTER — Ambulatory Visit
Admission: RE | Admit: 2020-10-26 | Discharge: 2020-10-26 | Disposition: A | Discharge status: Home / Self Care | Payer: Medicare HMO | Source: Ambulatory Visit | Attending: Neurological Surgery | Admitting: Neurological Surgery

## 2020-10-26 DIAGNOSIS — M5416 Radiculopathy, lumbar region: Secondary | ICD-10-CM

## 2020-10-26 MED ORDER — IOPAMIDOL (ISOVUE-M 200) INJECTION 41%
20.0000 mL | Freq: Once | INTRAMUSCULAR | Status: AC
  Administered 2020-10-26: 20 mL via INTRATHECAL

## 2020-10-26 MED ORDER — DIAZEPAM 5 MG PO TABS
5.0000 mg | ORAL_TABLET | Freq: Once | ORAL | Status: AC
  Administered 2020-10-26: 5 mg via ORAL

## 2020-10-26 NOTE — Discharge Instructions (Signed)
Post Procedure Spinal Discharge Instruction Sheet  You may resume a regular diet and any medications that you routinely take (including pain medications).  No driving day of procedure.  Light activity throughout the rest of the day.  Do not do any strenuous work, exercise, bending or lifting.  The day following the procedure, you can resume normal physical activity but you should refrain from exercising or physical therapy for at least three days thereafter.   Common Side Effects:  Headaches- take your usual medications as directed by your physician.  Increase your fluid intake.  Caffeinated beverages may be helpful.  Lie flat in bed until your headache resolves.  Restlessness or inability to sleep- you may have trouble sleeping for the next few days.  Ask your referring physician if you need any medication for sleep.  Facial flushing or redness- should subside within a few days.  Increased pain- a temporary increase in pain a day or two following your procedure is not unusual.  Take your pain medication as prescribed by your referring physician.  Leg cramps  Please contact our office at [phone] for the following symptoms: Fever greater than 100 degrees. Headaches unresolved with medication after 2-3 days. Increased swelling, pain, or redness at injection site.   Thank you for visiting [facility] today.   

## 2021-07-28 IMAGING — CT CT L SPINE W/ CM
1 of 6 series · 6 of 14 positions shown, 8 images · non-contrast
Comparison: Lumbar myelogram 01/25/2019

CLINICAL DATA: Status post lumbar surgery. Lumbar fusion.
Progressive left lower extremity radiculitis extending along the
lateral aspect of the left leg into the dorsum of the foot in the
toes.
TECHNIQUE: Contiguous axial images were obtained through the Lumbar spine after
the intrathecal infusion of infusion. Coronal and sagittal
reconstructions were obtained of the axial image sets.

[Series 3: l spine soft (person_name) · axial · 0.29mm/px · z∈[-298,-104]mm · 6 of 137 slices shown, 8 images]
[im 20/137  soft-tissue]
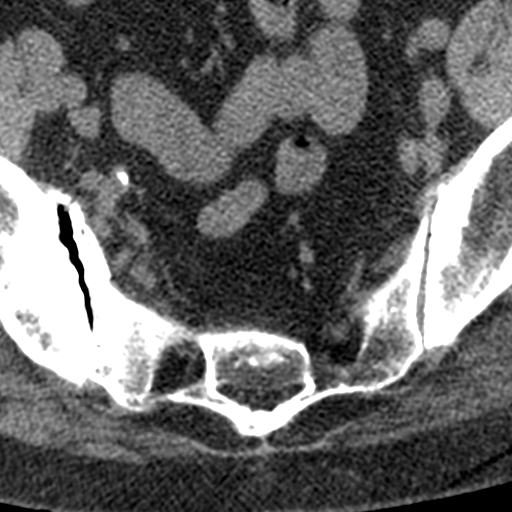
[im 20/137  bone]
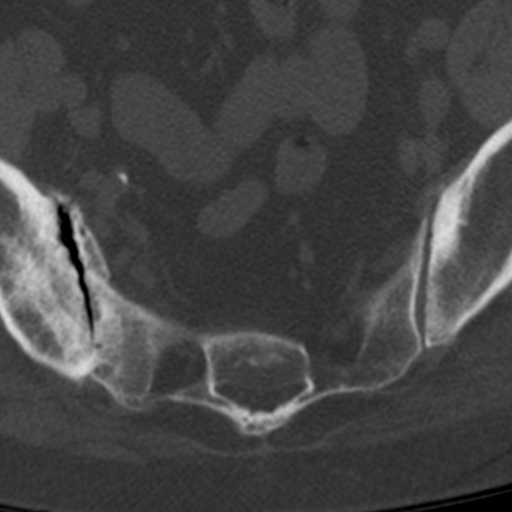
[im 39/137  bone]
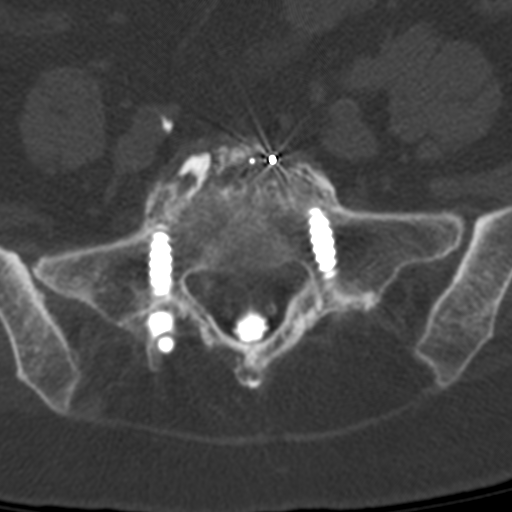
[im 59/137  bone]
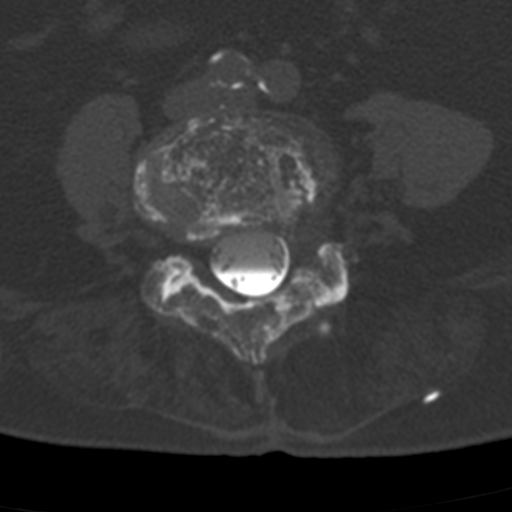
[im 78/137  bone]
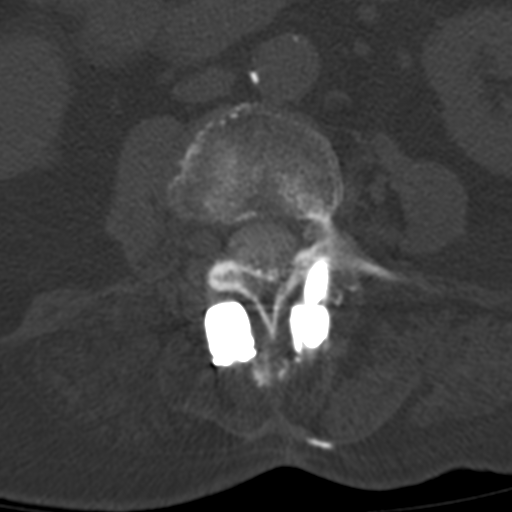
[im 98/137  soft-tissue]
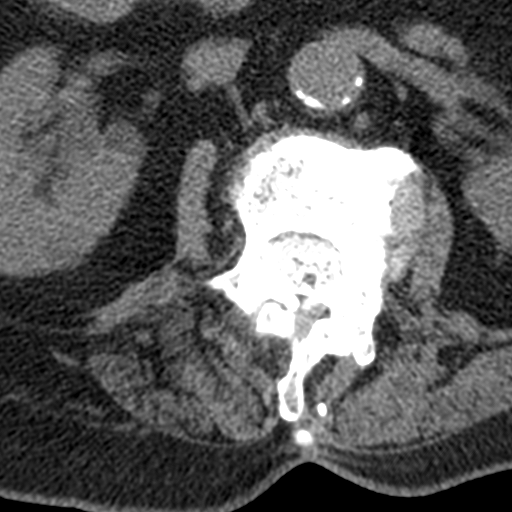
[im 98/137  bone]
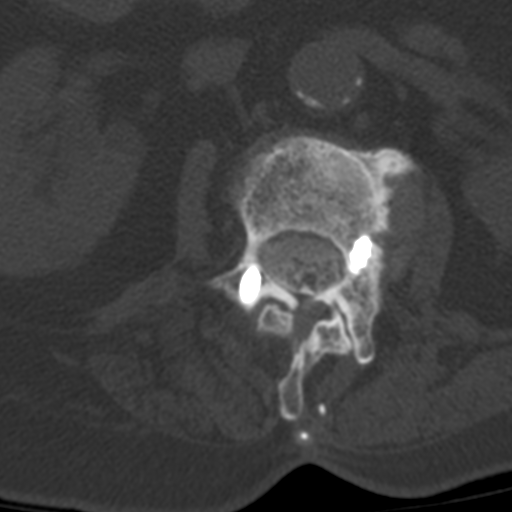
[im 117/137  bone]
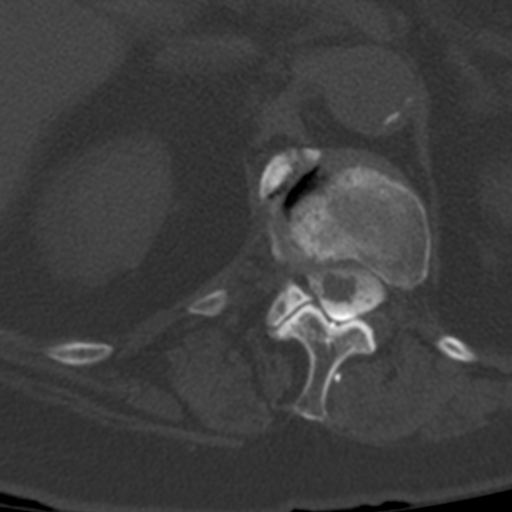

[6 of 14 positions shown; findings below may reference images not displayed]

EXAM:
LUMBAR MYELOGRAM

FLUOROSCOPY TIME:  Radiation Exposure Index (as provided by the
fluoroscopic device): 306.92 uGy*m2

PROCEDURE:
After thorough discussion of risks and benefits of the procedure
including bleeding, infection, injury to nerves, blood vessels,
adjacent structures as well as headache and CSF leak, written and
oral informed consent was obtained. Consent was obtained by Dr.
Aakash Shuster. Time out form was completed.

Patient was positioned prone on the fluoroscopy table. Local
anesthesia was provided with 1% lidocaine without epinephrine after
prepped and draped in the usual sterile fashion. Puncture was
performed at L5 using a 3 1/2 inch 22-gauge spinal needle via a
midline approach. Using a single pass through the dura, the needle
was placed within the thecal sac, with return of clear CSF. 15 mL of
Isovue X-700 was injected into the thecal sac, with normal
opacification of the nerve roots and cauda equina consistent with
free flow within the subarachnoid space.

I personally performed the lumbar puncture and administered the
intrathecal contrast. I also personally supervised acquisition of
the myelogram images.
FINDINGS: LUMBAR MYELOGRAM FINDINGS:

Solid fusion at L2-3, L3-4, L4-5, and L5-S1 is stable. The L2
pedicle screws encroach into the L1-2 disc space. Progressive
endplate changes are present at L1-2. Lower nerve roots fill
normally. Progressive endplate changes are also present at T12-L1.

CT LUMBAR MYELOGRAM FINDINGS:

Lumbar spine is imaged midbody of T11 through the sacrum. Pedicle
screws at L1 extend into the disc space, more prominently on the
right, similar to the prior exam. Hardware is otherwise
unremarkable. Solid fusion is present L2-S1.

Asymmetric sclerotic changes are noted about the SI joints, right
greater than left. There is some progression since the prior exam.

Conus medullaris terminates at L1-2, within normal limits. No
significant listhesis is present. Leftward curvature is centered at
T12 is stable. Rightward curvature is centered at L4-5.

Atherosclerotic calcifications are present in the aorta without
aneurysm. Solid organ lesions are present. No significant adenopathy
is present.

T11-12: Rightward disc protrusion is present. Asymmetric right-sided
facet hypertrophy noted. Mild right foraminal narrowing is present.

T12-L1: A rightward disc protrusion is present. Asymmetric
right-sided facet hypertrophy is present. Mild right foraminal
narrowing is stable.

L1-2: A rightward disc protrusion and asymmetric right-sided facet
hypertrophy has progressed. Mild left subarticular narrowing is
stable. Mild right subarticular narrowing has progressed. Moderate
right foraminal stenosis has progressed. Mild left foraminal
narrowing is stable.

L2-3: Solid fusion right laminectomy present. Central canal is
decompressed. Foramina are patent bilaterally.

L3-4: Solid fusion anteriorly and posteriorly. Central canal and
foramina are patent. Right laminectomy noted.

L4-5: Solid anterior and posterior fusion present. No residual or
recurrent stenosis.

L5-S1: Solid anterior posterior fusion. No residual or recurrent
stenosis.
IMPRESSION: 1. Solid fusion at L2-3, L3-4, L4-5, and L5-S1 without residual or
recurrent stenosis at these levels.
2. Progressive adjacent level disease at L1-2 progressive moderate
right subarticular and foraminal stenosis.
3. Mild left subarticular and foraminal narrowing at L1-2 is stable.
4. Mild right foraminal narrowing at T12-L1 is stable.
5. Mild right foraminal narrowing at T11-12 is stable.
6. Aortic Atherosclerosis ([id number]).
